# Patient Record
Sex: Male | Born: 1975 | Race: White | Hispanic: No
Health system: Western US, Academic
[De-identification: ages and names within clinical notes are randomized; demographics above are authoritative.]

## PROBLEM LIST (undated history)

## (undated) DIAGNOSIS — I2699 Other pulmonary embolism without acute cor pulmonale: Secondary | ICD-10-CM

## (undated) DIAGNOSIS — F32A Depression, unspecified: Secondary | ICD-10-CM

## (undated) DIAGNOSIS — F909 Attention-deficit hyperactivity disorder, unspecified type: Secondary | ICD-10-CM

## (undated) HISTORY — DX: Other pulmonary embolism without acute cor pulmonale: I26.99

---

## 2004-02-25 ENCOUNTER — Ambulatory Visit (INDEPENDENT_AMBULATORY_CARE_PROVIDER_SITE_OTHER): Payer: No Typology Code available for payment source

## 2004-02-25 ENCOUNTER — Encounter (INDEPENDENT_AMBULATORY_CARE_PROVIDER_SITE_OTHER): Payer: Self-pay | Admitting: Pediatric Emergency Medicine

## 2004-02-25 NOTE — Nursing Note (Signed)
>>   Charles Wagner, Charles Wagner     02/25/2004   4:09 pm  Preimmunization screening questions have been asked, no allergies to vaccine or components,VIS read, patient has signed immunization consent.

## 2004-04-26 ENCOUNTER — Ambulatory Visit (INDEPENDENT_AMBULATORY_CARE_PROVIDER_SITE_OTHER): Payer: Self-pay | Admitting: Nurse Practitioner

## 2004-04-26 NOTE — Progress Notes (Signed)
This patient failed a scheduled appointment today.  Disposition: Chart reviewed, no fu necessary

## 2004-08-28 ENCOUNTER — Encounter (INDEPENDENT_AMBULATORY_CARE_PROVIDER_SITE_OTHER): Payer: No Typology Code available for payment source | Admitting: Nurse Practitioner

## 2012-10-14 DIAGNOSIS — K4090 Unilateral inguinal hernia, without obstruction or gangrene, not specified as recurrent: Secondary | ICD-10-CM | POA: Insufficient documentation

## 2013-01-16 DIAGNOSIS — Z8709 Personal history of other diseases of the respiratory system: Secondary | ICD-10-CM | POA: Insufficient documentation

## 2013-01-16 DIAGNOSIS — Z9109 Other allergy status, other than to drugs and biological substances: Secondary | ICD-10-CM | POA: Insufficient documentation

## 2016-02-13 ENCOUNTER — Encounter (INDEPENDENT_AMBULATORY_CARE_PROVIDER_SITE_OTHER): Payer: PPO | Admitting: Student in an Organized Health Care Education/Training Program

## 2017-04-15 DIAGNOSIS — F3342 Major depressive disorder, recurrent, in full remission: Secondary | ICD-10-CM | POA: Insufficient documentation

## 2017-04-15 DIAGNOSIS — F419 Anxiety disorder, unspecified: Secondary | ICD-10-CM | POA: Insufficient documentation

## 2017-08-18 IMAGING — CR DX Humerus RT
2 series · 2 of 2 positions shown · non-contrast
Comparison: none

Examination: DX Humerus RT                                                                
 Reason for examination:  Injury . Right arm pain. Patient status post ground              
 level fall.
TECHNIQUE: 2 views of the right humerus were obtained.

[ap]
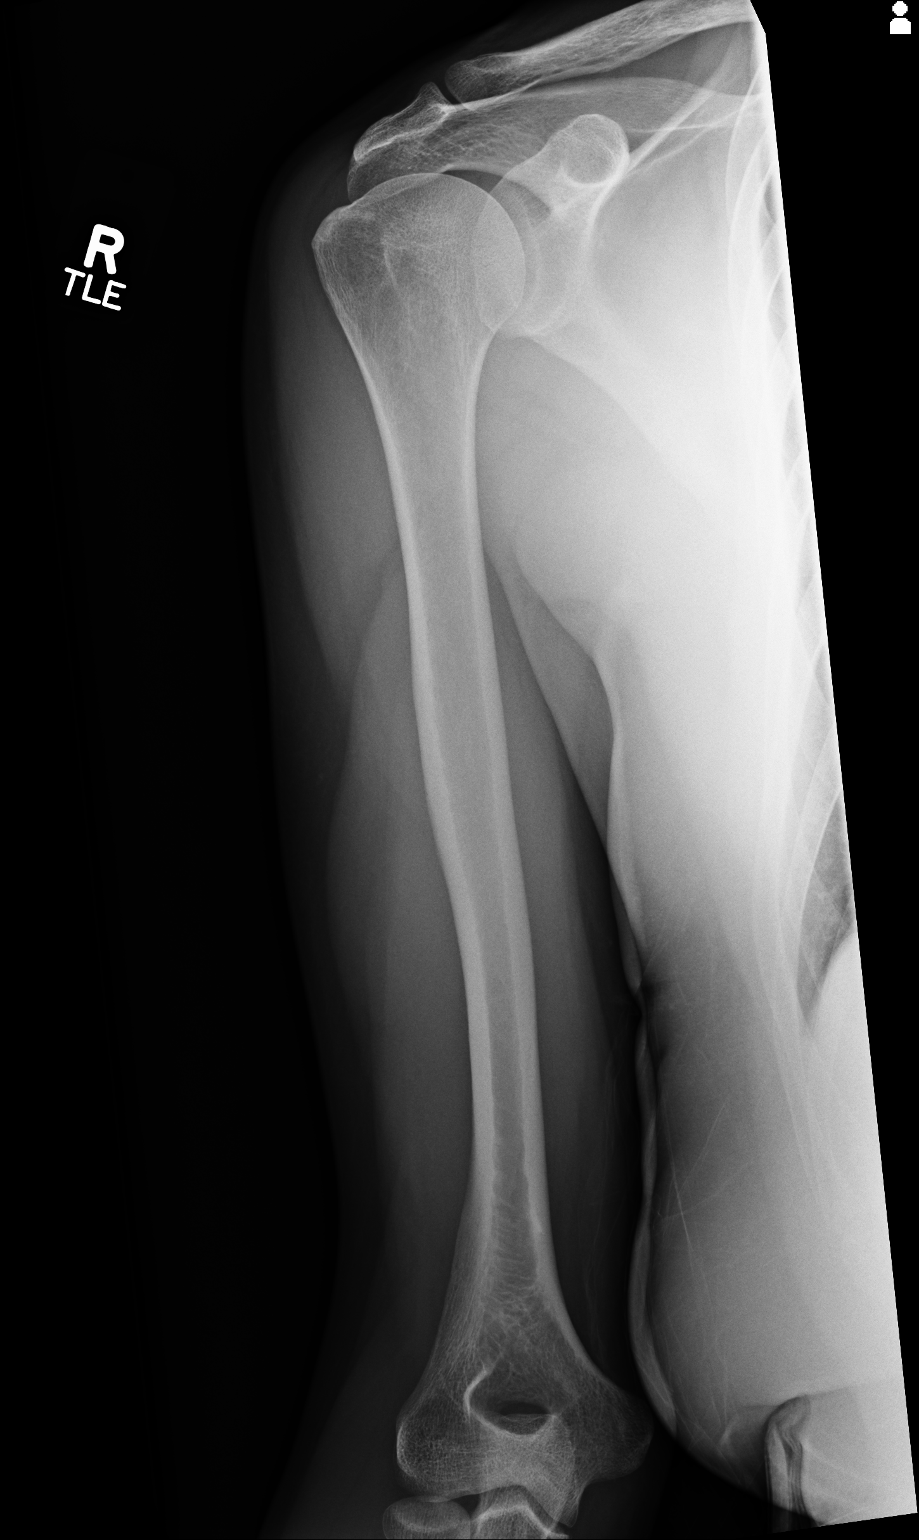

[lat]
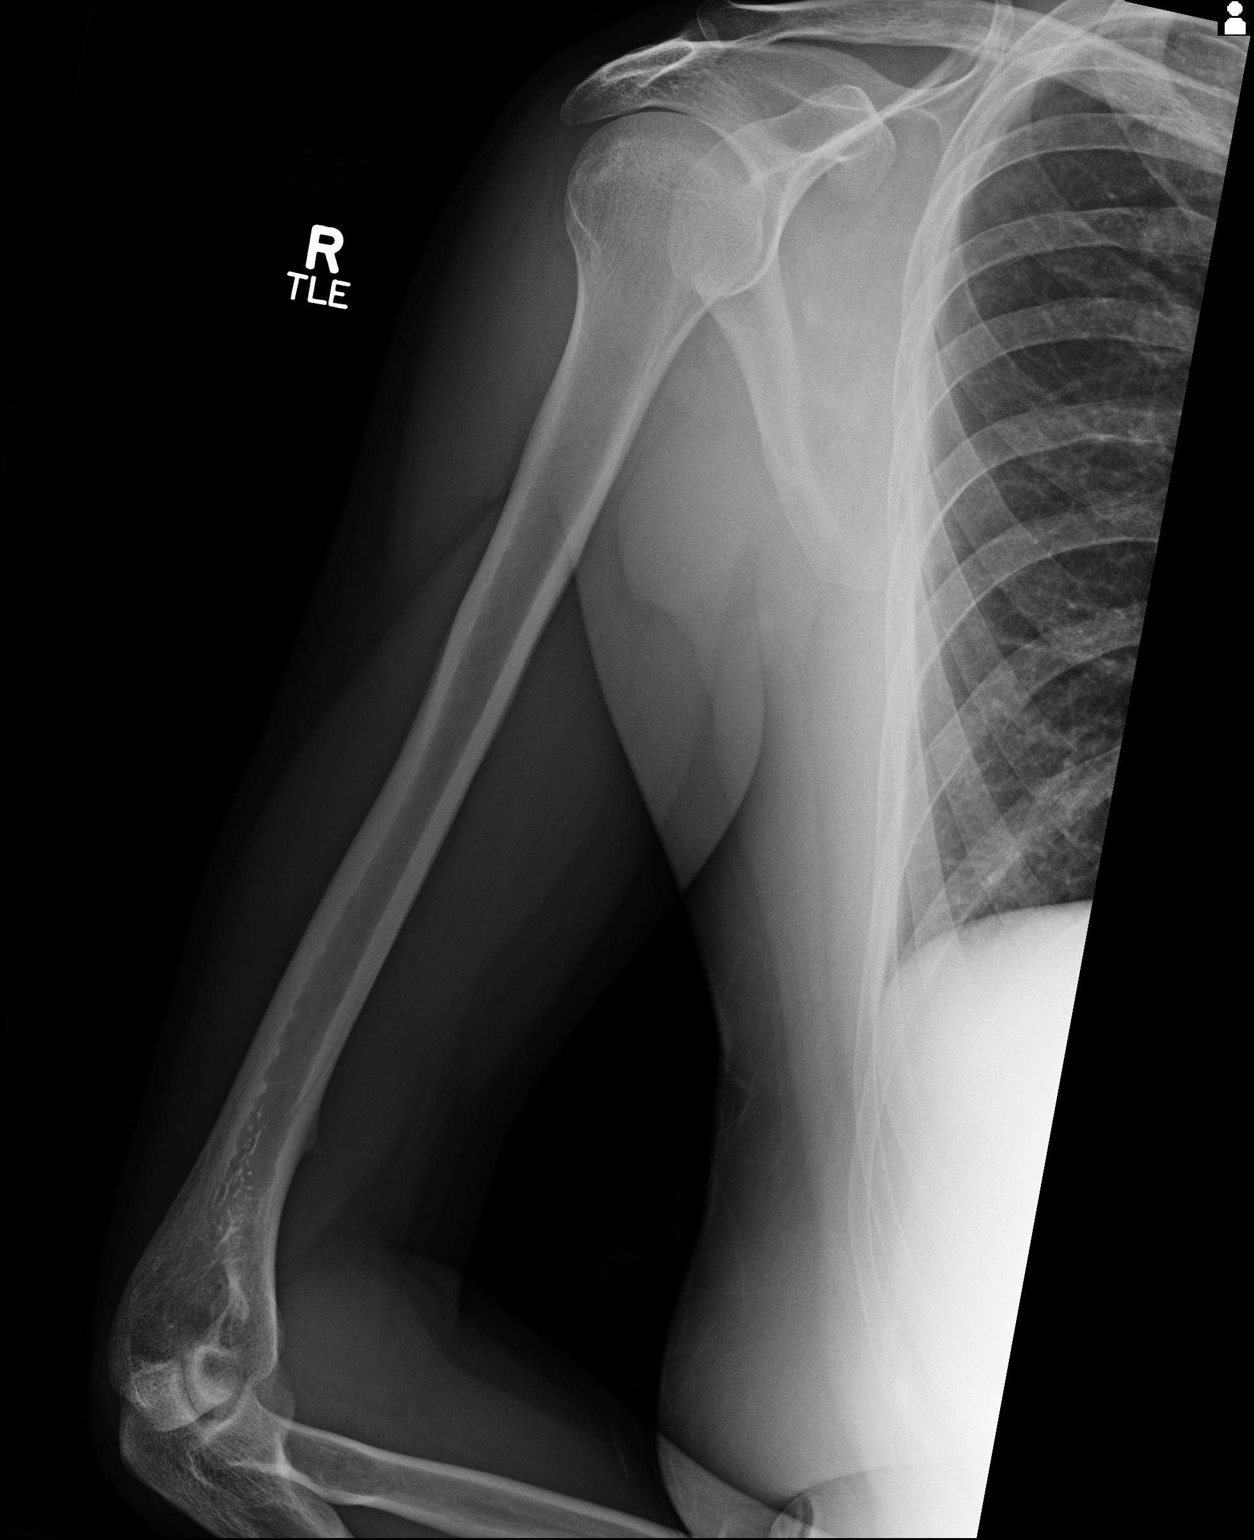

[2 of 2 positions shown; findings below may reference images not displayed]

FINDINGS: No acute fracture or dislocation. No periosteal reaction or osseous lesion.               
 Avian spur noted along the distal humerus, a normal variant.                              
 No significant soft tissue abnormalities.
IMPRESSION: No acute osseous abnormalities.

## 2021-01-04 DIAGNOSIS — F900 Attention-deficit hyperactivity disorder, predominantly inattentive type: Secondary | ICD-10-CM | POA: Insufficient documentation

## 2021-06-07 DIAGNOSIS — E781 Pure hyperglyceridemia: Secondary | ICD-10-CM | POA: Insufficient documentation

## 2021-08-04 ENCOUNTER — Emergency Department (EMERGENCY_DEPARTMENT_HOSPITAL): Payer: PRIVATE HEALTH INSURANCE

## 2021-08-04 ENCOUNTER — Emergency Department
Admission: EM | Admit: 2021-08-04 | Discharge: 2021-08-04 | Disposition: A | Discharge status: Home / Self Care | Payer: PRIVATE HEALTH INSURANCE | Attending: Internal Medicine | Admitting: Internal Medicine

## 2021-08-04 ENCOUNTER — Other Ambulatory Visit (HOSPITAL_BASED_OUTPATIENT_CLINIC_OR_DEPARTMENT_OTHER): Payer: Self-pay

## 2021-08-04 ENCOUNTER — Encounter (HOSPITAL_COMMUNITY): Payer: Self-pay | Admitting: Unknown Physician Specialty

## 2021-08-04 DIAGNOSIS — I2694 Multiple subsegmental pulmonary emboli without acute cor pulmonale: Secondary | ICD-10-CM

## 2021-08-04 DIAGNOSIS — R079 Chest pain, unspecified: Secondary | ICD-10-CM

## 2021-08-04 DIAGNOSIS — R0789 Other chest pain: Secondary | ICD-10-CM

## 2021-08-04 DIAGNOSIS — I498 Other specified cardiac arrhythmias: Secondary | ICD-10-CM

## 2021-08-04 DIAGNOSIS — M79662 Pain in left lower leg: Secondary | ICD-10-CM

## 2021-08-04 HISTORY — DX: Attention-deficit hyperactivity disorder, unspecified type: F90.9

## 2021-08-04 HISTORY — DX: Depression, unspecified: F32.A

## 2021-08-04 LAB — LAB ADD ON ORDER

## 2021-08-04 LAB — COMPREHENSIVE METABOLIC PANEL
ALT (GPT): 32 U/L (ref 10–64)
AST (GOT): 18 U/L (ref 9–38)
Albumin: 4.5 g/dL (ref 3.5–5.2)
Alkaline Phosphatase (Total): 56 U/L (ref 36–122)
Anion Gap: 9 (ref 4–12)
Bilirubin (Total): 0.7 mg/dL (ref 0.2–1.3)
Calcium: 9.5 mg/dL (ref 8.9–10.2)
Carbon Dioxide, Total: 26 meq/L (ref 22–32)
Chloride: 103 meq/L (ref 98–108)
Creatinine: 0.96 mg/dL (ref 0.51–1.18)
Glucose: 86 mg/dL (ref 62–125)
Potassium: 3.8 meq/L (ref 3.6–5.2)
Protein (Total): 7.5 g/dL (ref 6.0–8.2)
Sodium: 138 meq/L (ref 135–145)
Urea Nitrogen: 16 mg/dL (ref 8–21)
eGFR by CKD-EPI 2021: >60 mL/min/{1.73_m2} (ref >59)

## 2021-08-04 LAB — CBC, DIFF
% Basophils: 1 %
% Eosinophils: 2 %
% Immature Granulocytes: 1 %
% Lymphocytes: 16 %
% Monocytes: 13 %
% Neutrophils: 67 %
% Nucleated RBC: 0 %
Absolute Eosinophil Count: 0.24 10*3/uL (ref 0.00–0.50)
Absolute Lymphocyte Count: 1.68 10*3/uL (ref 1.00–4.80)
Basophils: 0.09 10*3/uL (ref 0.00–0.20)
Hematocrit: 43 % (ref 38.0–50.0)
Hemoglobin: 14.3 g/dL (ref 13.0–18.0)
Immature Granulocytes: 0.05 10*3/uL (ref 0.00–0.05)
MCH: 27.9 pg (ref 27.3–33.6)
MCHC: 33.4 g/dL (ref 32.2–36.5)
MCV: 84 fL (ref 81–98)
Monocytes: 1.36 10*3/uL — ABNORMAL HIGH (ref 0.00–0.80)
Neutrophils: 7.1 10*3/uL — ABNORMAL HIGH (ref 1.80–7.00)
Nucleated RBC: 0 10*3/uL
Platelet Count: 342 10*3/uL (ref 150–400)
RBC: 5.12 10*6/uL (ref 4.40–5.60)
RDW-CV: 12.9 % (ref 11.0–14.5)
WBC: 10.52 10*3/uL — ABNORMAL HIGH (ref 4.3–10.0)

## 2021-08-04 LAB — 1ST EXTRA PEARL TOP

## 2021-08-04 LAB — 1ST EXTRA ORANGE TOP

## 2021-08-04 LAB — 1ST EXTRA GOLD TOP

## 2021-08-04 LAB — D-DIMER,QUANT: D_Dimer, Quant: 1.12 ug{FEU}/mL — ABNORMAL HIGH (ref 0.00–0.59)

## 2021-08-04 LAB — TROPONIN_I
Troponin_I Interpretation: NORMAL
Troponin_I: <0.03 ng/mL (ref <0.04)

## 2021-08-04 LAB — PARTIAL THROMBOPLASTIN TIME: Partial Thromboplastin Time: 29 s (ref 22–35)

## 2021-08-04 LAB — PROTHROMBIN TIME
Prothrombin INR: 1 (ref 0.8–1.3)
Prothrombin Time Patient: 12.9 s (ref 10.7–15.6)

## 2021-08-04 LAB — MAGNESIUM: Magnesium: 2 mg/dL (ref 1.8–2.4)

## 2021-08-04 MED ORDER — ACETAMINOPHEN 500 MG OR TABS
1000.0000 mg | ORAL_TABLET | Freq: Once | ORAL | Status: AC
Start: 2021-08-04 — End: 2021-08-04
  Administered 2021-08-04: 1000 mg via ORAL
  Filled 2021-08-04: qty 2

## 2021-08-04 MED ORDER — IOHEXOL 350 MG/ML IV SOLN
100.0000 mL | Freq: Once | INTRAVENOUS | Status: AC | PRN
Start: 2021-08-04 — End: 2021-08-04
  Administered 2021-08-04: 100 mL via INTRAVENOUS

## 2021-08-04 MED ORDER — KETOROLAC TROMETHAMINE 15 MG/ML IJ SOLN
15.0000 mg | Freq: Once | INTRAMUSCULAR | Status: AC
Start: 2021-08-04 — End: 2021-08-04
  Administered 2021-08-04: 15 mg via INTRAVENOUS
  Filled 2021-08-04: qty 1

## 2021-08-04 MED ORDER — APIXABAN 5 MG OR TABS
5.0000 mg | ORAL_TABLET | Freq: Two times a day (BID) | ORAL | 1 refills | Status: DC
Start: 2021-08-04 — End: 2021-08-04

## 2021-08-04 MED ORDER — APIXABAN 5 MG OR TABS
ORAL_TABLET | ORAL | 0 refills | Status: DC
Start: 2021-08-04 — End: 2023-07-02
  Filled 2021-08-04: qty 28, 7d supply, fill #0
  Filled 2021-08-09: qty 28, 14d supply, fill #1

## 2021-08-04 NOTE — Discharge Instructions (Addendum)
You were evaluated in the ED for chest pain and found to have blood clots in your lungs. I have prescribed you a medicine that thins the blood. Please take this medicine as prescribed (10mg  twice daily for first 7 days, 5mg  twice daily for rest of time). It is very important to follow up with your [facility] for further anticoagulation management. Please return to ED if you experience worsening chest pain, shortness of breath, you pass out, or other concerning/worsening symptoms.    The Ultrasound shows no clot in the leg,

## 2021-08-04 NOTE — ED Triage Notes (Signed)
2 days of left chest wall pain, constant and "dull". +reproducible.  Non-toxic, no cough, no F/ch

## 2021-08-04 NOTE — Progress Notes (Signed)
Anticoagulation Education - DOAC    Primary learner(s): Patient    Interpreter was present: No    Direct oral anticoagulant: apixaban    Dosing regimen: 10 mg twice daily for 7 days, then 5 mg twice daily    Indication: VTE treatment    Factors influencing anticoagulation (e.g. renal dysfunction, drug interactions, etc): none    Education materials discussed and provided: Yes, DOAC handout.     DOAC Discussion Topics Included:   Rationale for therapy   Basic explanation of how medication works   Dosing and the importance of taking as instructed   Adverse effects including signs and symptoms of bleeding   Signs and symptoms of venous/pulmonary thromboembolism   Drug interactions    Medication cost/insurance coverage     Post-education response: States understanding    Marlow Baars, PharmD

## 2021-08-04 NOTE — ED Notes (Signed)
Pt arrives w/ c/o chest pain and L shoulder pain for approximately 2 days. Pt's father at bedside, endorses family history of MI. AOx4.      Orie Fisherman, RN  08/04/21 1126

## 2021-08-04 NOTE — ED Provider Notes (Signed)
CHIEF COMPLAINT   Chief Complaint   Patient presents with    Chest Pain            HISTORY OF PRESENT ILLNESS   46 y/o M with hx of ADHD and MDD presenting with 2 days of left sided chest pain that radiates to his left shoulder and neck. States it started late at night and is worse with movement. Describes it as a constant achy sensation with episodes of sharp pain with movement. Associated shortness of breath and pain with inspiration. Denies any trauma, heavy lifting, recent long car rides/plane rides, hormone use, hx of malignancy, recent surgery, hemoptysis. Has been taking tylenol with minimal relief in his pain. Endorses family history of hypertension, diabetes, and MI. Also states he was a prior smoker (quit in 2010). Denis any heavy alcohol use or drug use.                  PAST MEDICAL AND SURGICAL HISTORY     PMHx: ADHD, depression  PSx: denies           MEDICATIONS AND ALLERGIES     OUTPATIENT MEDICATIONS:   Current Outpatient Medications   Medication Instructions    apixaban (ELIQUIS) 5 mg, Oral, 2 times daily, Please take 32m twice daily for 7 days, then 576mtwice daily until you can be evaluated by your primary care physician       ALLERGIES:   Patient has no known allergies.          SOCIAL HISTORY   Social History     Tobacco Use    Smoking status: Former     Years: 10.00     Types: Cigarettes     Quit date: 2010     Years since quitting: 13.0   Substance Use Topics    Alcohol use: Yes    Drug use: Never              PAST FAMILY HISTORY   Family History     Problem (# of Occurrences) Relation (Name,Age of Onset)    Diabetes (1) Father    Heart Attack (1) Father             REVIEW OF SYSTEMS   Review of Systems  10/14 Review of Systems completed and negative except as stated above in the HPI (Systems reviewed: HENT, Eyes, Resp, CV, GI, GU, MSK, Skin, Neuro, Psych)           PHYSICAL EXAM   ED VITALS:  Vitals (Arrival)      T: 36 C (08/04/21 1117)  BP: 138/84 (08/04/21 1117)  HR: 79 (08/04/21  1117)  RR: 16 (08/04/21 1117)  SpO2: 97 % (08/04/21 1117) Room air   Vitals (Most recent in last 24 hrs)   T: 36.8 C (08/04/21 1123)  BP: 137/84 (08/04/21 1330)  HR: 90 (08/04/21 1330)  RR: 20 (08/04/21 1330)  SpO2: 100 % (08/04/21 1330) Room air  T range: Temp  Min: 36 C  Max: 36.8 C  (no weight taken for this visit)     (no height taken for this visit)     There is no height or weight on file to calculate BMI.       Physical Exam  Vitals and nursing note reviewed.   Constitutional:       General: He is not in acute distress.     Appearance: He is well-developed. He is not ill-appearing.   HENT:  Head: Normocephalic and atraumatic.   Eyes:      Extraocular Movements: Extraocular movements intact.      Pupils: Pupils are equal, round, and reactive to light.   Neck:      Musculoskeletal: Neck supple.   Cardiovascular:      Rate and Rhythm: Regular rhythm. Tachycardia present.      Heart sounds: Normal heart sounds.    No systolic murmur is present.   No diastolic murmur is present.  Pulmonary:      Effort: Pulmonary effort is normal. No respiratory distress.      Breath sounds: Normal breath sounds.   Chest:      Comments: Mild chest wall tenderness just inferior to nipple  Abdominal:      Palpations: Abdomen is soft.      Tenderness: There is no guarding or rebound.   Skin:     General: Skin is warm and dry.      Capillary Refill: Capillary refill takes less than 2 seconds.   Neurological:      General: No focal deficit present.      Mental Status: He is alert and oriented to person, place, and time.   Psychiatric:         Mood and Affect: Mood normal.               LABORATORY:   Labs Reviewed   CBC, DIFF - Abnormal       Result Value    WBC 10.52 (*)     RBC 5.12      Hemoglobin 14.3      Hematocrit 43      MCV 84      MCH 27.9      MCHC 33.4      Platelet Count 342      RDW-CV 12.9      % Neutrophils 67      % Lymphocytes 16      % Monocytes 13      % Eosinophils 2      % Basophils 1      % Immature  Granulocytes 1      Neutrophils 7.10 (*)     Absolute Lymphocyte Count 1.68      Monocytes 1.36 (*)     Absolute Eosinophil Count 0.24      Basophils 0.09      Immature Granulocytes 0.05      Nucleated RBC 0.00      % Nucleated RBC 0     D-DIMER,QUANT - Abnormal    D_Dimer, Quant 1.12 (*)    COMPREHENSIVE METABOLIC PANEL    Sodium 347      Potassium 3.8      Chloride 103      Carbon Dioxide, Total 26      Anion Gap 9      Glucose 86      Urea Nitrogen 16      Creatinine 0.96      Protein (Total) 7.5      Albumin 4.5      Bilirubin (Total) 0.7      Calcium 9.5      AST (GOT) 18      Alkaline Phosphatase (Total) 56      ALT (GPT) 32      eGFR by CKD-EPI 2021 >60     MAGNESIUM    Magnesium 2.0     PROTHROMBIN TIME    Prothrombin Time Patient 12.9  Prothrombin INR 1.0     PARTIAL THROMBOPLASTIN TIME    Partial Thromboplastin Time 29      Partial Thromboplastin X Mean        Value: To calculate the PTT X Mean divide PTT value by 29.   TROPONIN_I    Troponin_I <0.03      Troponin_I Interpretation Normal     LAB ADD ON ORDER    Lab Test Requested ddimer      Specimen Type/Description Blood      Sample To Use Most recent      Test Request Status Order Processed     PROTEIN C ACTIVITY    Protein C Activity        Value: Data entry correction for lab automation processing. Do not re-order in CPOE.   PROTEIN S ACTIVITY    Protein S Activity        Value: Data entry correction for lab automation processing. Do not re-order in CPOE.   FACTOR V ACTIVITY    Factor V Activity        Value: Data entry correction for lab automation processing. Do not re-order in CPOE.   FACTOR V ACTIVITY   PROTEIN C ACTIVITY   PROTEIN S ACTIVITY         IMAGING:     ED Wet Read -   CTA Chest PE w Contrast   Final Result   Two acute right lower lobe subsegmental pulmonary embolisms without evidence of RV pressure overload.      I have personally reviewed the images and agree with the report (or as edited).      XR Chest 1 View   Final Result       Lines and tubes: None.      Lungs: Clear.      Pleura: No effusion. No pneumothorax.      Heart and mediastinum: Unremarkable.      Bones: No acute or suspicious abnormality.      I have personally reviewed the images and agree with the report (or as edited).          Radiology Final Result -   No image results found.              EKG DOCUMENTATION      My interpretation is as follows:     Rate: 77  Rhythm:Rhythm: Normal sinus rhythm  Axis:Axis Interpretation: Normal  ST Changes:ST Changes: None  Ectopy:Ectopy: None  Intervals:Intervals: Normal           TRAUMA/STROKE/STEMI ACTIVATION    N/a             SUICIDE RISK EVALUATION                 SEPSIS               4TH  YEAR RESIDENT            ED COURSE/MEDICAL DECISION MAKING   ED Course as of 08/04/21 1352   Fri Aug 04, 2021   1147 XR Chest 1 View  I independently reviewed the image and no evidence of pneumonia, pneumothorax, hemothorax, widened mediastinum, rib fractures, pleural effusion, pulmonary edema, or other cardiopulmonary abnormality [ND]   1200 Tylenol and toradol ordered for pain control [ND]   1215 D-Dimer, Quant(!)  Elevated to 1.12. Will further evaluate with CT PE. Patient updated on plan [ND]   1215 Troponin-I  Within normal limits. Given chest pain onset 2 days ago,  no indication for delta and low suspicion for NSTEMI, myocarditis at this time [ND]   1243 CBC, CMP, coags are within normal limits and without evidence of anemia, AKI, electrolyte abnormality, liver dysfunction [ND]   1323 HEART score of 1: low risk [ND]   6283 CTA Chest PE w Contrast  CTPE notable for two acute subsegmental Pes in the right lower lobe without evidence of RV strain [ND]   1336 D/w patient results of CT and evidence of blood clots. No evidence of RV strain or hemodynamic instability requiring admission to the hospital. Symptoms likely due to multiple Pes. No inciting event so will likely require anticoagulation for life. Plan to discharge patient on eliquis 80m BID  x 7 days and 56mBID for at least 3 months [ND]   [id number] at length with the patient in regards to his results. He verified that he will be able to follow up with his PCM for anticoagulation following. He was cautioned about his increase bleeding risk and strict return precautions were discussed. Additional labs were sent to facilitate work up on etiology of patient's pulmonary emboli. All questions were answered and he was discharged in stable condition. [ND]      ED Course User Index  [ND] DoLen Blalock [age]/o M with hx of ADHD, depression presenting with 2 days of left sided chest pain. Vital signs upon presentation notable for a slightly elevated HR of 103, normotension, afebrile, satting >95% on room air. Nontoxic appearing. Physical exam notable for mild left sided chest wall tenderness to palpation just inferior to the nipple, otherwise negative. ECG notable for sinus rhythm at 77bpm without evidence of ST elevation/depression, q waves, t wave inversion. Differential includes but is not limited to ACS, PE, pneumonia, pericarditis, myocarditis, musculoskeletal pain, among others. Will obtain lab work, CXR for further evaluation. Low risk via [place]. Low suspicion for acute aortic dissection given hemodynamic stability, no evidence of widened mediastinum on CXR, no pulsatile mass palpated on physical exam.                CLINICAL IMPRESSION/DISPOSITION   Clinical Impressions:   [I26.94] Multiple subsegmental pulmonary emboli without acute cor pulmonale (HCC)         Disposition: Discharge         CRITICAL CARE - ATTENDING ONLY            ADDITIONAL INFORMATION REVIEWED  - ATTENDING ONLY             DoLen BlalockResident  08/04/21 1353    Pleuritic CP and SOB  Considered ACS, STEMI, pneumonia, trauma, dissection, GI  CT.pe multiple subseg PE  Etiology unclear  No RV strain.  DC w script for DOAC. Encouraged PCP FU for workup    I saw and evaluated the patient. I have reviewed the  resident's/fellow's findings and agree.     No Critical Care       NiCarollee LeitzMD  08/16/21 1019

## 2021-08-07 ENCOUNTER — Encounter (HOSPITAL_COMMUNITY): Payer: Self-pay

## 2021-08-07 ENCOUNTER — Other Ambulatory Visit (HOSPITAL_BASED_OUTPATIENT_CLINIC_OR_DEPARTMENT_OTHER): Payer: Self-pay

## 2021-08-07 LAB — PROTEIN S ACTIVITY: Protein S Activity: 88 % (ref 65–150)

## 2021-08-07 LAB — PROTEIN C ACTIVITY: Protein C Activity: 118 % (ref 65–150)

## 2021-08-07 LAB — EKG 12 LEAD
Atrial Rate: 77 {beats}/min
Diagnosis: NORMAL
P Axis: 61 degrees
P-R Interval: 146 ms
Q-T Interval: 374 ms
QRS Duration: 86 ms
QTC Calculation: 423 ms
R Axis: 64 degrees
T Axis: 63 degrees
Ventricular Rate: 77 {beats}/min

## 2021-08-08 ENCOUNTER — Other Ambulatory Visit (HOSPITAL_BASED_OUTPATIENT_CLINIC_OR_DEPARTMENT_OTHER): Payer: Self-pay

## 2021-08-09 ENCOUNTER — Other Ambulatory Visit (HOSPITAL_BASED_OUTPATIENT_CLINIC_OR_DEPARTMENT_OTHER): Payer: Self-pay

## 2021-08-28 DIAGNOSIS — I2694 Multiple subsegmental pulmonary emboli without acute cor pulmonale: Secondary | ICD-10-CM | POA: Insufficient documentation

## 2021-09-21 DIAGNOSIS — Z1589 Genetic susceptibility to other disease: Secondary | ICD-10-CM | POA: Insufficient documentation

## 2023-07-02 ENCOUNTER — Telehealth: Payer: 59 | Admitting: Family

## 2023-07-02 DIAGNOSIS — Z86711 Personal history of pulmonary embolism: Secondary | ICD-10-CM

## 2023-07-02 DIAGNOSIS — E7211 Homocystinuria: Secondary | ICD-10-CM | POA: Insufficient documentation

## 2023-07-02 MED ORDER — APIXABAN 2.5 MG OR TABS
2.5000 mg | ORAL_TABLET | Freq: Two times a day (BID) | ORAL | 1 refills | Status: DC
Start: 2023-07-02 — End: 2023-09-30

## 2023-07-02 NOTE — Progress Notes (Signed)
 Distant Site Telemedicine Encounter  I conducted this encounter via secure, live, face-to-face video conference with the patient. I reviewed the risks and benefits of telemedicine as pertinent to this visit and the patient agreed to proceed.    Provider Location: Off-site location (home, non-[place] location)  Patient Location: At work    Present with patient: No one else present     Chief Complaint   Patient presents with    Medication Management        Subjective:     Charles Wagner is a 47 year old male who presents requesting a refill for Eliquis/Apixaban    Eliquis since Jan 2023. Dx Pulmonary embolisms. No precipitating event.   Seen by hematology for f/u  See 2023 note for details - blood test reviewed hypercoagulable state. Advised to continue anticoagulation indefinitely    Has been uninsured the last few months, has not taken  Now with insurance, new job. Would very much like to restart  Asymptomatic.     Previous primary care through [facility]  PCP retired and [facility] does not take new insurance  Working on establishing primary care at [employer]         Objective:    Vitals: There were no vitals taken for this visit.  Physical Exam  Constitutional:       General: Does not appear to be in distress, converses easily, appears comfortable   Pulmonary:      Effort: Pulmonary effort is normal, able to speak in complete sentences without difficulty   Neurological:      Mental Status: Alert and oriented to person, place, and time.   Psychiatric:         Mood and Affect: Mood normal.         Behavior: Behavior normal.         Thought Content: Thought content normal.         Judgment: Judgment normal.            Assessment and Plan:    Charles Wagner was seen today for medication management.    Hyperhomocysteinemia (HCC)  Notes reviewed  Rx sent  Ideally will establish care with new PCP in the next 3-6 months. If not, be seen by [facility] or other provider for refill, check in  Earlier if any concerns    -     apixaban (Eliquis) 2.5 MG tablet; Take  1 tablet (2.5 mg) by mouth 2 times a day.  Dispense: 180 tablet; Refill: 1    History of pulmonary embolus (PE)  2023, unprovoked  -     apixaban (Eliquis) 2.5 MG tablet; Take 1 tablet (2.5 mg) by mouth 2 times a day.  Dispense: 180 tablet; Refill: 1      The patient's identification was verified by using two patient identifiers and the following: physically presented government photo ID.

## 2023-07-11 NOTE — Telephone Encounter (Signed)
 Message reviewed. Will await the PA for the Eliquis. Patient to be on this medication indefinitely due to a h/o unprovoked PE.

## 2023-07-12 ENCOUNTER — Encounter: Payer: Self-pay | Admitting: Family Medicine

## 2023-07-12 NOTE — Telephone Encounter (Signed)
 Form completed  Please fax to "PA Fax #" ASAP    Daena Alper Lowry Ram  [facility]  07/12/23

## 2023-07-12 NOTE — Telephone Encounter (Signed)
 Have we received fax from pharmacy?    Ashby Dawes  [facility]  07/12/23

## 2023-07-12 NOTE — Telephone Encounter (Signed)
Disregard faxing form  I called insurance, spoke with them for 25 minutes  Med approved  Case ID: [id number]  Patient should be able to get his Eliquis now.  Please let them know and apologies for the delay.    Ashby Dawes  [facility]  07/12/23

## 2023-07-16 NOTE — Progress Notes (Signed)
 Spoke with insurance on 07/12/23, per agent, Eliquis approved after telephone call to insurance lasting 25+ minutes.  Case ID #[id number]    Ashby Dawes  [place]  07/16/23

## 2023-09-30 ENCOUNTER — Other Ambulatory Visit: Payer: Self-pay

## 2023-09-30 ENCOUNTER — Encounter (INDEPENDENT_AMBULATORY_CARE_PROVIDER_SITE_OTHER): Payer: Self-pay

## 2023-09-30 ENCOUNTER — Ambulatory Visit (INDEPENDENT_AMBULATORY_CARE_PROVIDER_SITE_OTHER): Payer: 59

## 2023-09-30 VITALS — BP 124/74 | HR 54 | Temp 97.7°F | Resp 16 | Ht 74.0 in | Wt 208.0 lb

## 2023-09-30 DIAGNOSIS — Z86711 Personal history of pulmonary embolism: Secondary | ICD-10-CM

## 2023-09-30 DIAGNOSIS — Z23 Encounter for immunization: Secondary | ICD-10-CM

## 2023-09-30 DIAGNOSIS — Z1211 Encounter for screening for malignant neoplasm of colon: Secondary | ICD-10-CM

## 2023-09-30 DIAGNOSIS — Z9109 Other allergy status, other than to drugs and biological substances: Secondary | ICD-10-CM

## 2023-09-30 DIAGNOSIS — E7211 Homocystinuria: Secondary | ICD-10-CM

## 2023-09-30 DIAGNOSIS — Z0000 Encounter for general adult medical examination without abnormal findings: Secondary | ICD-10-CM

## 2023-09-30 DIAGNOSIS — E782 Mixed hyperlipidemia: Secondary | ICD-10-CM

## 2023-09-30 LAB — A1C RAPID, ONSITE: Hemoglobin A1C: 5.1 % (ref 4.0–6.0)

## 2023-09-30 MED ORDER — APIXABAN 2.5 MG OR TABS
2.5000 mg | ORAL_TABLET | Freq: Two times a day (BID) | ORAL | 3 refills | Status: DC
Start: 2023-09-30 — End: 2024-01-03

## 2023-09-30 MED ORDER — FLUTICASONE PROPIONATE 50 MCG/ACT NA SUSP
1.0000 | Freq: Every day | NASAL | 11 refills | Status: DC
Start: 2023-09-30 — End: 2024-06-18

## 2023-09-30 MED ORDER — INFLUENZA VIRUS VACC SPLIT PF 0.5 ML IM SUSY
0.5000 mL | PREFILLED_SYRINGE | Freq: Once | INTRAMUSCULAR | Status: AC
Start: 2023-09-30 — End: 2023-09-30
  Administered 2023-09-30: 0.5 mL via INTRAMUSCULAR

## 2023-09-30 MED ORDER — COVID-19 MRNA VACC (MODERNA) 50 MCG/0.5ML IM SUSY
50.0000 ug | PREFILLED_SYRINGE | Freq: Once | INTRAMUSCULAR | Status: AC
Start: 2023-09-30 — End: 2024-09-29

## 2023-09-30 NOTE — Progress Notes (Signed)
 Preventative Health Care Updates   Since your last visit, please tell us if you have had any of the below services outside of [facility]:                                                                            Cervical screening/PAP:N/A      Mammo: N/A      Colon Screen: NO   If yes, location/ date.    Specialty Care Updates  Have seen a specialist since your last visit: NO   If yes, Name, location and date.    Any forms to complete today? NO     HM Due:     Health Maintenance   Topic Date Due    Hepatitis C Screening  Never done    Prostate Cancer Screening Education  Never done    Depression Screening (PHQ-2)  Never done    Hepatitis B Vaccine (3 of 3 - 3-dose series) 08/01/1992    Lipid Disorders Screening  Never done    Colorectal Cancer Screening  Never done    COVID-19 Vaccine (4 - 2024-25 season) 03/31/2023    Influenza Vaccine (1) 04/30/2023    DTaP, Tdap and Td Vaccines (3 - Td or Tdap) 04/16/2027    HIV Screening  Completed    Hepatitis A Vaccine  Aged Out    Meningococcal B Vaccine  Aged Out    Pneumococcal Vaccine: Pediatrics (0-5 years) and At-Risk Patients (6-49 years)  Aged Out    HPV Vaccine  Aged Out           Future Appointments   Date Time Provider Department Center   09/30/2023  2:20 PM Halstrom, Leeanne Mannan, MD Steve Rattler Marissa Calamity

## 2023-09-30 NOTE — Progress Notes (Signed)
 Vaccine Screening Questions    Interpreter: No    1. Are you allergic to Latex? NO    2.  Have you had a serious reaction or an allergic reaction to a vaccine?  NO    3.  Currently have a moderate or severe illness, including fever?  NO    4.  Ever had a seizure or any neurological problem associated with a vaccine? (DTaP/TDaP/DTP pertinent) NO    5.  Is patient receiving any live vaccinations today? (Varicella-Chickenpox, MMR-Measles/Mumps/Rubella, Zoster-Shingles, Flumist, Yellow Fever) NOTE: oral rotavirus is exempt  NO    If YES to any of the questions above - Do NOT give vaccine.  Consult with RN or provider in clinic.  (#5 can be YES if all Live vaccine questions are answered NO)    If NO to all questions above - Patient may receive vaccine.    6. Are you pregnant or is there a chance you could become pregnant during the next month (HPV pertinent) NO    7. Do you need to receive the Flu vaccine today? Yes    All patients are encouraged to wait 15 minutes before leaving after receiving any vaccine.    VIS given 09/30/2023 by Mariann Barter, CMA.    Injection given today without initial adverse effect. YES    Mariann Barter, CMA

## 2023-09-30 NOTE — Progress Notes (Signed)
 Use of Ambient Listening:   Was ambient listening technology used during this visit and was verbal consent for recording obtained? Yes, verbal consent was obtained      Chief Complaint   Patient presents with    Wellness    Insomnia     Started this week       Subjective:  Charles Wagner is a 48 year old male who presents on 09/30/23.      History of Present Illness  Charles Wagner is a 48 year old male who presents for a wellness visit.    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 He also takes an over-the-counter vitamin cocktail, possibly Metanx, which he orders online. His lab results stabilized after starting this regimen, but he continues on Eliquis.    He is concerned about his cardiovascular health as he enters middle age, given his family history of cardiovascular issues. His father had a heart attack in his thirties and multiple heart surgeries, and both paternal grandparents died of heart disease. He maintains an active lifestyle, walking three to five miles a day, and follows a whole foods diet. He quit smoking 15 years ago and consumes alcohol moderately.    He has a history of depression and ADHD. He participated in a medical study for a new antidepressant, which he found effective, but the study is ending soon. He has tried Bupropion in the past without success.    He experiences chronic migraines, about one or two per month, and recently had episodes of vertigo that he self-diagnosed as vestibular migraines. He takes Flonase nightly for narrow nasal passages, which he finds significantly improves his quality of life.    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 He practices good sleep hygiene and uses vitamin D and melatonin.    He has no history of cancer in his family but  is aware of the need for regular screenings. He is considering colon cancer screening options as he has reached the eligible age. He has not had a recent eye exam but reports no significant vision changes. He is up to date with COVID and flu vaccinations but has not received them this year. Hepatitis B immunity status is unknown, and he is open to checking titers.  LIFESTYLE  Current dietary habits: healthy diet in general  Supplements: mthr vitamin supplement   Current exercise habits: yes, engages in regular exercise    Social Determinants of Health:  Social Determininants of Health affecting medical care: na    Intimate Partner Violence Screening:  History of sexual or physical abuse: No  History of any other forms of abuse (e.g. verbal, financial): No  Has the patient been hit, kicked, punched, or otherwise hurt by someone within the past year?: No    CANCER SCREENING  Family history of colon cancer: NO  Family history of prostate cancer: NO  Family history of breast cancer: NO      Review Of Systems  Reviewed, pertinent ROS reflected in HPI    I have reviewed and updated the problem list, medical history, family history, social history, medications, allergies in the electronic medical record.    Objective:    Vitals   Reviewed  Objective   Blood pressure 124/74, pulse (!) 54, temperature  36.5 C, temperature source Temporal, resp. rate 16, height 6\' 2"  (1.88 m), weight 94.3 kg (208 lb), SpO2 100%.      Physical Exam  Vitals reviewed.   Constitutional:       General: He is not in acute distress.     Appearance: Normal appearance. He is not toxic-appearing.   HENT:      Head: Normocephalic and atraumatic.   Eyes:      General: No scleral icterus.     Pupils: Pupils are equal, round, and reactive to light.   Cardiovascular:      Rate and Rhythm: Normal rate and regular rhythm.      Heart sounds: Normal heart sounds. No murmur heard.  Pulmonary:      Effort: Pulmonary effort is normal. No respiratory distress.       Breath sounds: Normal breath sounds.   Abdominal:      General: Abdomen is flat. There is no distension.      Palpations: Abdomen is soft.      Tenderness: There is no abdominal tenderness.   Musculoskeletal:      Right lower leg: No edema.      Left lower leg: No edema.   Skin:     General: Skin is warm and dry.      Findings: No lesion or rash.   Neurological:      Mental Status: He is alert. Mental status is at baseline.   Psychiatric:         Mood and Affect: Mood normal.         Behavior: Behavior normal.           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 On Eliquis 2.5 mg indefinitely as per hematology recommendation. No recent embolic events.  - Continue Eliquis 2.5 mg indefinitely.  - Order homocysteine level check.  - Consider hematology consult if needed.    Hyperlipidemia  Slightly elevated cholesterol with strong family history of cardiovascular disease. Discussed statin therapy as potential for reducing risk.   - Order lipid panel.  - Consider statin therapy based on lipid panel results and family history.  - Discuss cardiovascular risk reduction strategies.    Chronic Migraines  Chronic migraines with recent vertigo episodes, possibly vestibular migraines.  - Monitor migraine frequency and symptoms.  - Consider further evaluation if vertigo episodes persist.    Insomnia  Insomnia episodes likely related to stress from acting. Practices good sleep hygiene.  - Continue sleep hygiene practices.  - Consider melatonin and vitamin D supplementation.    Depression  Depression improved on investigational antidepressant. Concern about potential relapse post-study.  - Monitor mood and symptoms.  - Contact study team, pcp if symptoms return.  - Consider alternative treatments if needed.      Colorectal Cancer Screening  Eligible for colorectal cancer screening at age 26. Prefers  colonoscopy for longer interval between screenings.  - Schedule colonoscopy.  - Instruct to hold Eliquis 2 days prior to procedure.    Prostate Cancer Screening  Discussed prostate cancer screening options. Prefers to defer screening for a few years.  - Revisit prostate cancer screening in a few years.    Vaccinations  Due for influenza and COVID vaccinations.  - Administer influenza vaccine.  - Clinic out of COVID vaccine; pharmacy encouraged  Hepatitis B Immunity  Potential waning immunity to Hepatitis B. No high-risk behaviors reported.  - Check Hepatitis B titers.    Nasal Congestion  Uses Flonase for narrow nasal passages. Reports significant improvement.  - Continue Flonase as needed.         Patient instructed to return to clinic with any new, worsening, or unresolved concerns.

## 2023-09-30 NOTE — Patient Instructions (Signed)
 Welcome,  Charles Wagner , and thank you for choosing me as your primary care doctor! I'm committed to providing you with personalized and comprehensive care.    Preventive care is the cornerstone of my practice. I expect all patients to schedule an in-person visit at least once a year for preventive care and lab work. This allows [place] to monitor your health, address concerns, and maintain the highest standard of care. I prefer in person appointments for multiple concerns rather than telehealth. Long term medication refills will not be provided unless seen in person at least once a year. Additionally, prescriptions for controlled substances require follow-up visits a minimum of every three months.     For non-urgent questions sent via MyChart, responses typically take 3-5 business days. If your query requires more consideration and time, the clinical support staff may recommend scheduling a telehealth visit. Your insurance may be billed for time spent on mychart care coordination. For urgent symptoms, please contact our triage line or seek emergency care.    To continue your care:  1. Schedule your follow up appointments promptly either at the front desk after your visit or through our phone/online booking system.  2. Bring any forms you need filled out with you to your appointments or send ahead via mychart.   3. Come prepared with any questions or concerns.  4. If you have concerns or need help scheduling an appointment start by calling our clinic directly at [phone].     If you have any questions, feel free to contact us. I look forward to partnering with you for your health.    Warm regards,  Charlene Brooke, MD

## 2023-10-01 LAB — CBC, DIFF
% Basophils: 1 %
% Eosinophils: 3 %
% Immature Granulocytes: 0 %
% Lymphocytes: 20 %
% Monocytes: 10 %
% Neutrophils: 66 %
% Nucleated RBC: 0 %
Absolute Eosinophil Count: 0.2 10*3/uL (ref 0.00–0.50)
Absolute Lymphocyte Count: 1.47 10*3/uL (ref 1.00–4.80)
Basophils: 0.05 10*3/uL (ref 0.00–0.20)
Hematocrit: 42 % (ref 38.0–50.0)
Hemoglobin: 13.9 g/dL (ref 13.0–18.0)
Immature Granulocytes: 0.01 10*3/uL (ref 0.00–0.05)
MCH: 27.8 pg (ref 27.3–33.6)
MCHC: 33.4 g/dL (ref 32.2–36.5)
MCV: 83 fL (ref 81–98)
Monocytes: 0.76 10*3/uL (ref 0.00–0.80)
Neutrophils: 4.82 10*3/uL (ref 1.80–7.00)
Nucleated RBC: 0 10*3/uL
Platelet Count: 379 10*3/uL (ref 150–400)
RBC: 5 10*6/uL (ref 4.40–5.60)
RDW-CV: 12.8 % (ref 11.0–14.5)
WBC: 7.31 10*3/uL (ref 4.3–10.0)

## 2023-10-01 LAB — LIPID PANEL
Cholesterol/HDL Ratio: 3.7
HDL Cholesterol: 52 mg/dL (ref >39)
LDL Cholesterol, NIH Equation: 113 mg/dL (ref <130)
Non-HDL Cholesterol: 141 mg/dL (ref 0–159)
Total Cholesterol: 193 mg/dL (ref <200)
Triglyceride: 162 mg/dL — ABNORMAL HIGH (ref <150)

## 2023-10-01 LAB — COMPREHENSIVE METABOLIC PANEL
ALT (GPT): 18 U/L (ref 10–64)
AST (GOT): 18 U/L (ref 9–38)
Albumin: 4.4 g/dL (ref 3.5–5.2)
Alkaline Phosphatase (Total): 50 U/L (ref 39–139)
Anion Gap: 12 (ref 4–12)
Bilirubin (Total): 0.5 mg/dL (ref 0.2–1.3)
Calcium: 9.5 mg/dL (ref 8.9–10.2)
Carbon Dioxide, Total: 26 meq/L (ref 22–32)
Chloride: 104 meq/L (ref 98–108)
Creatinine: 1.01 mg/dL (ref 0.51–1.18)
Glucose: 78 mg/dL (ref 62–125)
Potassium: 4.1 meq/L (ref 3.6–5.2)
Protein (Total): 6.8 g/dL (ref 6.0–8.2)
Sodium: 142 meq/L (ref 135–145)
Urea Nitrogen: 15 mg/dL (ref 8–21)
eGFR by CKD-EPI 2021: >60 mL/min/{1.73_m2} (ref >59)

## 2023-10-01 LAB — HEPATITIS B SURFACE AB
Hepatitis B Surf Antibody Intl Units: <8 m[IU]/mL
Hepatitis B Surface Ab: NONREACTIVE

## 2023-10-01 LAB — TSH WITH REFLEXIVE FREE T4: Thyroid Stimulating Hormone: 2.904 u[IU]/mL (ref 0.400–5.000)

## 2023-10-01 LAB — HOMOCYSTEINE: Homocysteine: 9.7 umol/L (ref 6.2–15.0)

## 2024-01-03 ENCOUNTER — Telehealth: Payer: Self-pay

## 2024-01-03 DIAGNOSIS — E7211 Homocystinuria: Secondary | ICD-10-CM

## 2024-01-03 DIAGNOSIS — Z86711 Personal history of pulmonary embolism: Secondary | ICD-10-CM

## 2024-01-03 MED ORDER — APIXABAN 2.5 MG OR TABS
2.5000 mg | ORAL_TABLET | Freq: Two times a day (BID) | ORAL | 3 refills | Status: AC
Start: 2024-01-03 — End: 2025-01-02

## 2024-01-03 NOTE — Telephone Encounter (Signed)
 Incoming fax from pharmacy.    Issue: Refill request:    Last appt date: 3/25  Previously prescribed on 09/2023    Rx and pharmacy pended    Medication:  apixaban  (Eliquis ) 2.5 MG tablet       Routing to PCP.

## 2024-01-07 ENCOUNTER — Other Ambulatory Visit: Payer: Self-pay

## 2024-01-07 DIAGNOSIS — Z86711 Personal history of pulmonary embolism: Secondary | ICD-10-CM

## 2024-01-07 DIAGNOSIS — E7211 Homocystinuria: Secondary | ICD-10-CM

## 2024-01-25 ENCOUNTER — Ambulatory Visit (INDEPENDENT_AMBULATORY_CARE_PROVIDER_SITE_OTHER)

## 2024-01-25 ENCOUNTER — Other Ambulatory Visit (HOSPITAL_BASED_OUTPATIENT_CLINIC_OR_DEPARTMENT_OTHER): Payer: Self-pay

## 2024-01-25 ENCOUNTER — Encounter (INDEPENDENT_AMBULATORY_CARE_PROVIDER_SITE_OTHER): Payer: Self-pay

## 2024-01-25 VITALS — BP 121/73 | HR 101 | Temp 99.4°F | Resp 17

## 2024-01-25 DIAGNOSIS — N492 Inflammatory disorders of scrotum: Secondary | ICD-10-CM

## 2024-01-25 LAB — CBC (HEMOGRAM)
Hematocrit: 43 % (ref 38.0–50.0)
Hemoglobin: 14.2 g/dL (ref 13.0–18.0)
MCH: 28.3 pg (ref 27.3–33.6)
MCHC: 33.2 g/dL (ref 32.2–36.5)
MCV: 85 fL (ref 81–98)
Platelet Count: 348 10*3/uL (ref 150–400)
RBC: 5.02 10*6/uL (ref 4.40–5.60)
RDW-CV: 12.7 % (ref 11.0–14.5)
WBC: 19.41 10*3/uL — ABNORMAL HIGH (ref 4.3–10.0)

## 2024-01-25 LAB — COMPREHENSIVE METABOLIC PANEL
ALT (GPT): 28 U/L (ref 10–64)
AST (GOT): 25 U/L (ref 9–38)
Albumin: 4.7 g/dL (ref 3.5–5.2)
Alkaline Phosphatase (Total): 48 U/L (ref 39–139)
Anion Gap: 8 (ref 4–12)
Bilirubin (Total): 0.7 mg/dL (ref 0.2–1.3)
Calcium: 9.6 mg/dL (ref 8.9–10.2)
Carbon Dioxide, Total: 29 meq/L (ref 22–32)
Chloride: 99 meq/L (ref 98–108)
Creatinine: 0.93 mg/dL (ref 0.51–1.18)
Glucose: 81 mg/dL (ref 62–125)
Potassium: 3.9 meq/L (ref 3.6–5.2)
Protein (Total): 7.3 g/dL (ref 6.0–8.2)
Sodium: 136 meq/L (ref 135–145)
Urea Nitrogen: 13 mg/dL (ref 8–21)
eGFR by CKD-EPI 2021: >60 mL/min/{1.73_m2} (ref >59)

## 2024-01-25 LAB — C_REACTIVE PROTEIN: C_Reactive Protein: 53.3 mg/L — ABNORMAL HIGH (ref 0.0–10.0)

## 2024-01-25 MED ORDER — CEFADROXIL 500 MG OR CAPS
500.0000 mg | ORAL_CAPSULE | Freq: Two times a day (BID) | ORAL | 0 refills | Status: AC
Start: 2024-01-25 — End: 2024-02-01
  Filled 2024-01-25: qty 14, 7d supply, fill #0

## 2024-01-25 MED ORDER — CEFADROXIL 500 MG OR CAPS
500.0000 mg | ORAL_CAPSULE | Freq: Two times a day (BID) | ORAL | 0 refills | Status: DC
Start: 2024-01-25 — End: 2024-01-25

## 2024-01-25 NOTE — Progress Notes (Signed)
 [facility] Visit    Date of Visit: 01/25/2024    Patient Name: Charles Wagner, he/him/his  Patient MRN: [id number]  Patient DOB: 07-Sep-1975      Charles Wagner is a 48 year old male who presents to clinic for   Chief Complaint   Patient presents with    Infection     Symptoms x24 hours / chills / fever (peak 16f) / fatigue / body aches / vertigo / lightheadedness / symptoms worsening x5 hours / open sore under groin (filled with fluid) x1 week         Use of Ambient Listening:   Was ambient listening technology used during this visit and was verbal consent for recording obtained? Yes, used in visit. Consent was obtained.        ASSESSMENT & PLAN    1. Scrotal abscess (Primary)  -  Patient nontoxic or ill in appearance.  Mildly tachycardic and febrile but all other vitals are stable.  Patient into clinic for evaluation of what he suspects is abscess to the bottom of his scrotum which first appeared approximately 5 days ago.  Reports he has been able to get a small amount of drainage out of it but does not feel size is decreasing.  Upon physical examination patient does have approximately 3.5 cm oval-shaped area of induration, mild erythema, small area of fluctuance with obvious head with yellow drainage. No adenopathy to bilateral groin. Elected to perform incision and drainage and was able to get a small amount of yellow purulent drainage out. I did attempt to deloculate abscess and there is a track going up patients' right side.   I did perform wound culture.  I am going to check CBC, CMP, CRP. Also going to start patient on cefadroxil twice daily for 7 days.  Would like him to do 3-4 warm soaks daily to promote further drainage.  Discussed if any acute worsening in the next 24 to 48 hours would like him to present to ER.  If improving after starting the cefadroxil can RTC in 5 days regardless for recheck.  - Culture Wound, Bact w/ Stain; Future  - Cefadroxil 500 MG capsule; Take 1 capsule  (500 mg) by mouth 2 times a day for 7 days.  Dispense: 14 capsule; Refill: 0  - CBC; Future  - Comprehensive Metabolic Panel; Future  - C-Reactive Protein; Future  - C-Reactive Protein  - Comprehensive Metabolic Panel  - CBC            The above plan was reviewed with the patient at today's visit, all patient questions were answered, and the patient was able to state their understanding.      _____________________________________________________________________    SUBJECTIVE  History of Present Illness  Charles Wagner is a 48 year old male who presents with symptoms suggestive of an infection, including chills, fatigue, body aches, and an open sore to the bottom of his scrotum.  Noticed wound on scrotum approx 5 days ago; maybe from shaving.    Feels symptoms have worsened over the last 5 days. He has had chills for approximately 24 hours, with a recorded temperature of 100F.     He has been cleaning the area diligently. The sore feels fluid-filled and has been sore since its appearance. He reports minimal drainage from the sore and describes it as tender but not painful.          Review of Systems  See HPI  Review of patient's allergies indicates:  No Known Allergies    Past Medical History:   Diagnosis Date    ADHD     Depression     Pulmonary embolism (HCC)        History reviewed. No pertinent surgical history.    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    History of asthma    Hypertriglyceridemia     Multiple subsegmental pulmonary emboli without acute cor pulmonale (HCC)    Recurrent major depression in full remission    Right inguinal hernia         Current Outpatient Medications:     apixaban  (Eliquis ) 2.5 MG tablet, Take 1 tablet (2.5 mg) by mouth 2 times a day., Disp: 180 tablet, Rfl: 3    fluticasone  propionate 50 MCG/ACT nasal spray, Spray 1 spray into each nostril daily., Disp: 11.1 mL, Rfl: 11    Current Facility-Administered Medications:     COVID-19 Moderna mRNA vaccine (Spikevax) injection 50 mcg, 50 mcg, Intramuscular, Once, Halstrom, Vernell Caldron, MD      The following portions of the patient's history were reviewed and updated as appropriate: problem list, current medications, allergies, past medical history, past surgical history, past social history, and past family history.    OBJECTIVE  BP 121/73   Pulse (!) 101   Temp 37.4 C (Oral)   Resp 17   SpO2 99%     BP Readings from Last 3 Encounters:   01/25/24 121/73   09/30/23 124/74   08/04/21 129/81         Vital signs from today's visit have been reviewed and are pertinent for WNL.        Physical Exam  Vitals and nursing note reviewed.   Constitutional:       General: He is not in acute distress.     Appearance: Normal appearance. He is normal weight. He is not ill-appearing, toxic-appearing or diaphoretic.   Pulmonary:      Effort: No respiratory distress.   Genitourinary:     Penis: Normal.       Testes:         Right: Tenderness and swelling present.       Lymphadenopathy:      Lower Body: No right inguinal adenopathy. No left inguinal adenopathy.   Skin:     General: Skin is warm and dry.      Capillary Refill: Capillary refill takes less than 2 seconds.   Neurological:      General: No focal deficit present.      Mental Status: He is alert and oriented to person, place, and time. Mental status is at baseline.   Psychiatric:         Mood and Affect: Mood normal.         Behavior: Behavior normal.  Thought Content: Thought content  normal.         Judgment: Judgment normal.         Incision and Drainage    Date/Time: 01/25/2024 5:24 PM    Performed by: Kingston Pagan, ARNP  Authorized by: Kingston Pagan, ARNP    Consent:     Consent obtained:  Verbal    Consent given by:  Patient    Risks discussed:  Bleeding, incomplete drainage, pain, infection and damage to other organs    Alternatives discussed:  No treatment  Location:     Type:  Abscess    Size:  3.5 cm    Location:  Anogenital    Anogenital location:  Scrotal space  Pre-procedure details:     Skin preparation:  Betadine  Anesthesia (see MAR for exact dosages):     Anesthesia method:  Local infiltration    Local anesthetic:  Lidocaine 1% w/o epi  Procedure type:     Complexity:  Simple  Procedure details:     Incision types:  Stab incision    Incision depth:  Submucosal    Scalpel blade:  11    Wound management:  Probed and deloculated    Drainage:  Purulent and bloody    Drainage amount:  Scant    Wound treatment:  Wound left open    Packing materials:  None  Post-procedure details:     Patient tolerance of procedure:  Tolerated well, no immediate complications            -Pagan Kingston, FNP-C  [facility]          Parts of this note may have been completed using Dragon voice recognition dictation software. It has been reviewed for accuracy but please excuse any transcription errors due to limitations of Animal nutritionist.

## 2024-01-25 NOTE — Patient Instructions (Signed)
-    Warm soaks or baths 4 times daily.  Tylenol  and ibuprofen for pain and inflammation.

## 2024-01-26 LAB — WOUND CULTURE W/GRAM ORDER

## 2024-01-26 NOTE — Result Encounter Note (Signed)
 Prelim culture with no growth. Patient prescribed cefadroxil. Awaiting final.    Hosey CHRISTELLA Evans, MD

## 2024-01-28 ENCOUNTER — Telehealth (INDEPENDENT_AMBULATORY_CARE_PROVIDER_SITE_OTHER)

## 2024-01-28 ENCOUNTER — Encounter (INDEPENDENT_AMBULATORY_CARE_PROVIDER_SITE_OTHER): Payer: Self-pay

## 2024-01-28 DIAGNOSIS — F3342 Major depressive disorder, recurrent, in full remission: Secondary | ICD-10-CM

## 2024-01-28 MED ORDER — BUPROPION HCL ER (XL) 150 MG OR TB24
EXTENDED_RELEASE_TABLET | ORAL | 0 refills | Status: DC
Start: 2024-01-28 — End: 2024-06-18

## 2024-01-28 NOTE — Progress Notes (Signed)
 Preventative Health Care Updates   Since your last visit, please tell us  if you have had any of the below services outside of [facility]:                                                                            Cervical screening/PAP:N/A      Mammo: N/A      Colon Screen: NO   If yes, location/ date.    Specialty Care Updates  Have seen a specialist since your last visit: NO   If yes, Name, location and date.    Any forms to complete today? NO     HM Due:     Health Maintenance Due   Topic    Colorectal Cancer Screening     COVID-19 Vaccine (4 - 2024-25 season)    Depression Monitoring (PHQ-9)              Health Maintenance   Topic Date Due    Colorectal Cancer Screening  Never done    COVID-19 Vaccine (4 - 2024-25 season) 03/31/2023    Depression Monitoring (PHQ-9)  02/22/2024    Hepatitis B Vaccine (3 of 3 - 3-dose series) 09/29/2024 (Originally 08/01/1991)    Prostate Cancer Screening Education  09/29/2025 (Originally 05/27/1976)    Influenza Vaccine (1) 04/29/2024    Diabetes Screening  01/25/2027    DTaP, Tdap and Td Vaccines (3 - Td or Tdap) 04/16/2027    Lipid Disorders Screening  09/29/2028    HIV Screening  Completed    Hepatitis A Vaccine  Aged Out    Meningococcal B Vaccine  Aged Out    Pneumococcal Vaccine: Pediatrics (0-5 years) and At-Risk Patients (6-49 years)  Aged Out    HPV Vaccine  Aged Out    Hepatitis C Screening  Discontinued         Future Appointments   Date Time Provider Department Center   01/28/2024  9:20 AM Halstrom, Vernell Caldron, MD [facility]     Answers submitted by the patient for this visit:  Health Screening: Depression (Submitted on 01/23/2024)  PHQ9 SCORE: 19  Health Screening: Anxiety (Submitted on 01/23/2024)  GAD7 SCORE: 11

## 2024-01-28 NOTE — Progress Notes (Signed)
 Distant Site Telemedicine Encounter  I conducted this encounter via secure, live, face-to-face video conference with the patient. I reviewed the risks and benefits of telemedicine as pertinent to this visit and the patient agreed to proceed.    Provider Location: On-site location (clinic, hospital, on-site office)  Patient Location: At work    Present with patient: No one else present         Chief Complaint   Patient presents with    Medication Management     Antidepressants          Subjective:     Charles Wagner is a 48 year old male who presents on 01/28/2024.    Study ended end of may, has been off for four weeks. Has had issues since then. He is back to classic deprssion-apathy, slow moving, loss of concentration, negative thoughts. Was on buporprion prior to study which was ok. Lexapro had side effects-mostly sexual side effects. Phq9 19. Had been told he could try duloxetine by study people. His therapist retired.   Abscess is getting better, leaking more fluid, fever broke.        Objective:    Vitals: There were no vitals taken for this visit.  Physical Exam  Constitutional:       General: He is not in acute distress.     Appearance: Normal appearance.   HENT:      Head: Normocephalic and atraumatic.   Eyes:      General: No scleral icterus.     Extraocular Movements: Extraocular movements intact.      Conjunctiva/sclera: Conjunctivae normal.   Pulmonary:      Effort: Pulmonary effort is normal. No respiratory distress.   Neurological:      General: No focal deficit present.      Mental Status: He is alert and oriented to person, place, and time.   Psychiatric:         Mood and Affect: Mood normal.         Behavior: Behavior normal.              Assessment and Plan:      Kohei was seen today for medication management.    Recurrent major depression in full remission  Trial buprroprion, will inc to max dose. Discussed safety plan should things worsen. Rtc 6 weeks  -     buPROPion XL 150 MG 24 hr tablet; Take 1  tablet (150 mg) by mouth daily for 14 days, THEN 2 tablets (300 mg) daily for 30 days, THEN 3 tablets (450 mg) daily.  Dispense: 164 tablet; Refill: 0    Abscess    - encouraged fu and discussed alarm signs and return precautions       Vernell Head, MD  Internal Medicine Physician   [facility], [facility]    Answers submitted by the patient for this visit:  Health Screening: Depression (Submitted on 01/23/2024)  PHQ9 SCORE: 19  Health Screening: Anxiety (Submitted on 01/23/2024)  GAD7 SCORE: 11

## 2024-01-29 ENCOUNTER — Other Ambulatory Visit (INDEPENDENT_AMBULATORY_CARE_PROVIDER_SITE_OTHER): Payer: Self-pay | Admitting: Family Practice

## 2024-01-29 DIAGNOSIS — N492 Inflammatory disorders of scrotum: Secondary | ICD-10-CM

## 2024-01-29 LAB — WOUND C/S W/GRAM (ANAEROBIC)
Culture: NO GROWTH — AB
Gram Smear: NONE SEEN
Gram Smear: NONE SEEN
Gram Smear: NONE SEEN

## 2024-01-29 MED ORDER — SULFAMETHOXAZOLE-TRIMETHOPRIM 800-160 MG OR TABS
1.0000 | ORAL_TABLET | Freq: Two times a day (BID) | ORAL | 0 refills | Status: AC
Start: 2024-01-29 — End: 2024-02-05

## 2024-01-29 NOTE — Result Encounter Note (Signed)
 CALL patient    Final wound culture is growing Staph aureus that is resistant to cefadroxil . Recommend the following:    Stop cefadroxil   Start trimethoprim /sulfamethoxazole  1 double strength tab by mouth every 12 hours for 7 days  return to clinic as previously recommended. If symptoms are worsening, recommend being seen immediately in the emergency department.    Prescription sent to [facility], [place]

## 2024-05-27 ENCOUNTER — Telehealth (HOSPITAL_BASED_OUTPATIENT_CLINIC_OR_DEPARTMENT_OTHER): Payer: Self-pay | Admitting: Gastroenterology

## 2024-05-27 DIAGNOSIS — Z1211 Encounter for screening for malignant neoplasm of colon: Secondary | ICD-10-CM

## 2024-05-27 MED ORDER — PEG 3350-KCL-NABCB-NACL-NASULF 236 G OR SOLR
ORAL | 0 refills | Status: DC
Start: 2024-05-27 — End: 2024-06-18

## 2024-05-27 MED ORDER — NA SULFATE-K SULFATE-MG SULF 17.5-3.13-1.6 GM/177ML OR SOLN
ORAL | 0 refills | Status: DC
Start: 2024-05-27 — End: 2024-06-09

## 2024-05-27 NOTE — Telephone Encounter (Signed)
 Hello All,     The following patient has been scheduled for a colonoscopy.      MRN: [id number]  DATE AND TIME: 07/06/2025  0900  REFERRAL NUMBER: [id number]  PROVIDER: Hujoel, Isabel Angela, MD   PHONE NUMBER:   Home Phone [phone]   Work Phone Not on file.   Mobile [phone]      Prior colonoscopy procedure?: NO  Diabetic?: NO  Are you using any blood thinners or anticoagulation medications such as   (ensure you are mentioning brand and generic name to patient)       -Coumadin (warfarin, war-furr-in)   NO       -Plavix (clopidogrel, kloh-pid-oh-grel) NO       -Fragmin (dalteparin, dall-tep-arin) NO       -Effient (prasugrel, prah-soo-grel) NO       -Lovenox(enoxaparin, e-nox-a-pare-in) NO       -Xarelto (rivaroxaban, reev-a-rox-a-ban) NO       -Pradaxa (dabigatran, da-bye-ga-tran) NO       -Eliquis  (apixaban , a-pix-a-ban) YES  (If yes to above) What is the name of the doctor who prescribes your blood thinners: Dr. Clarisse  Are you allergic to either of the following medications?      -Versed (a sedative): NO      -Fentanyl (a narcotic pain reliever): NO  Have you been diagnosed with sleep apnea?: NO  Have you been told you're a difficult IV to start?: NO  Currently experiencing constipation lasting longer than 3 days?: NO  Do you have any mobility restrictions, such as a cane, walker, or wheelchair, where you might need assistance transferring to a stretcher?: NO     Chronic health condition(s) that impact scheduling:                 End Stage Kidney or End Stage Liver Disease: NO  Currently on Kidney Dialysis: NO  Do you use supplemental oxygen at home?: NO   Diagnosed with a bleeding disorder? (e.g. Hemophilia or low platelet count [aka thrombocytopenia]): NO  Congestive heart failure, pacemaker, or implantable defibrillator: NO  On a daily basis, do you use narcotics, alcohol/beer, or recreational drugs (including marijuana)?:  NO              Drinks: N/A daily (1-2 drinks, ok to schedule)               Narcotics: NO daily              Recreational Drugs: NO daily (Marijuana ok)  Any issues with moderate sedation in the past (difficulty waking up, allergic reactions, difficulty breathing): NO  Have you received an organ transplant?: NO  Currently pregnant? (only ask if male and under the age of 44): NO  Are you taking any medications for diabetes or weight loss such as the ones below?   Dulaglutide (Trulicity) NO  Exenatide extended release (Bydureon) NO  Exenatide (Byetta) NO  Liraglutide (Victoza, Saxenda) NO  Lixisenatide (Adlyxin) NO  Semaglutide (Ozempic, Rybelsus, Wegovy) NO  Tirzepatide (Mounjaro, Zepbound) NO  If patient is taking any medications please advise of the following:              -Follow a clear liquid diet for 24 hours prior to the procedure              -Hold dosage on day of procedure to take after the procedure     Estimated body mass index is 26.71 kg/m as calculated from the following:    Height  as of 09/30/23: 6' 2 (1.88 m).    Weight as of 09/30/23: 94.3 kg (208 lb).     Patient's Preferred Pharmacy Location: [facility] [address] [place], [place], [place] [address]   Additional prep notes:  E-mail address: [email or web address]         Patient advised of escort: YES  Name of escort: Norleen  Are you able to provide consent for your own procedure?: YES  Patient advised on how to receive prep instructions:  YES        Thank You,  Norberto L  Referral Team        **PLEASE NOTE**     When replying to this message,  You must CC the following pool:     P [facility] SCHEDULING LIASON     Or the referral team will not receive the message

## 2024-05-27 NOTE — Telephone Encounter (Signed)
 Patient is scheduled. Sent to fc for prior auth, pended RX to provider to sign, Patient scheduled at [facility], Sent prep/appt information to patients mychart.

## 2024-05-29 ENCOUNTER — Encounter (INDEPENDENT_AMBULATORY_CARE_PROVIDER_SITE_OTHER): Payer: Self-pay

## 2024-06-01 ENCOUNTER — Encounter (INDEPENDENT_AMBULATORY_CARE_PROVIDER_SITE_OTHER): Payer: Self-pay

## 2024-06-06 ENCOUNTER — Ambulatory Visit: Payer: Self-pay

## 2024-06-06 NOTE — Telephone Encounter (Signed)
 Patient identity confirmed x3 HIPAA identifiers.     Patient called to voice complaint of new worsening vertigo.    Patient reports he has had vertigo this past week. Patient states this has been happening daily at least once daily, usually lasting less than 30 seconds. He had a 4 hour long period with vertigo. Patient describes as a zap in his brain like a lightening bolt and then the room spinning. Patient states zap is not a painful feeling like a headache, feels like a brain scramble. Patient states he is still alert and oriented when this is happening able to maintain conversation and thoughts. Has balance issues when room is spinning. Has chronic migraines, 1-2 per month. Patient is on blood thinner due to clotting disorder.    Patient advised to see physician within 24 hours per Dizziness - Vertigo-ADULT-AH protocol, patient verbalized understanding.    Patient was advised to call back for any new or worsening symptoms or further questions, patient verbalized understanding.      Reason for Disposition   [1] MODERATE dizziness (e.g., vertigo; feels very unsteady, interferes with normal activities) AND [2] has NOT been evaluated by physician for this    Additional Information   Negative: Followed a head injury   Negative: Followed an ear injury   Negative: Localized weakness or numbness is main symptom   Negative: Dizziness relates to riding in a car, going to an amusement park, etc.   Negative: [1] Dizziness is main symptom AND [2] NO spinning sensation (i.e., vertigo)   Negative: [1] Weakness (i.e., paralysis, loss of muscle strength) of the face, arm or leg on one side of the body AND [2] sudden onset AND [3] present now   Negative: [1] Numbness (i.e., loss of sensation) of the face, arm or leg on one side of the body AND [2] sudden onset AND [3] present now   Negative: [1] Loss of speech or garbled speech AND [2] sudden onset AND [3] present now   Negative: Difficult to awaken or acting confused (e.g.,  disoriented, slurred speech)   Negative: Sounds like a life-threatening emergency to the triager   Negative: SEVERE dizziness (vertigo) (e.g., unable to walk without assistance)   Negative: Severe headache (e.g., excruciating)  (Exception: similar to previous migraines)   Negative: [1] Dizziness (vertigo) present now AND [2] one or more STROKE RISK FACTORS (i.e., hypertension, diabetes, prior stroke/TIA, heart attack)  (Exception: prior physician evaluation for this AND no different/worse than usual)   Negative: [1] Dizziness (vertigo) present now AND [2] age > 30  (Exception: prior physician evaluation for this AND no different/worse than usual)   Negative: [1] Weakness (i.e., paralysis, loss of muscle strength) of the face, arm / hand, or leg / foot on one side of the body AND [2] sudden onset AND [3] brief (now gone)   Negative: [1] Numbness (i.e., loss of sensation) of the face, arm / hand, or leg / foot on one side of the body AND [2] sudden onset AND [3] brief (now gone)   Negative: [1] Loss of speech or garbled speech AND [2] sudden onset AND [3] brief (now gone)   Negative: Loss of vision or double vision (Exception: similar to previous migraines)   Negative: Patient sounds very sick or weak to the triager   Negative: Taking a medicine that could cause dizziness (e.g., phenytoin [Dilantin], carbamazepine [Tegretol], primidone [Mysoline])    Answer Assessment - Initial Assessment Questions  1. DESCRIPTION: Describe your dizziness.  Patient describes as room spinning, and balance is off.   2. VERTIGO: Do you feel like either you or the room is spinning or tilting?       Room is spinning  3. LIGHTHEADED: Do you feel lightheaded? (e.g., somewhat faint, woozy, weak upon standing)      Feels lightheaded, does get lightheaded when going from sitting to standing when outside only. Also happens with exertion.  4. SEVERITY: How bad is it?  Can you walk?    - MILD: Feels slightly dizzy and unsteady, but  is walking normally.    - MODERATE: Feels unsteady when walking, but not falling; interferes with normal activities (e.g., school, work).    - SEVERE: Unable to walk without falling, or requires assistance to walk without falling.      When symptomatic is moderate to severe.  5. ONSET:  When did the dizziness begin?      Initial vertigo happened approximately a year ago. Worsening over the past week.   6. AGGRAVATING FACTORS: Does anything make it worse? (e.g., standing, change in head position)      Patient cannot name a specific trigger or aggravating factor.  7. CAUSE: What do you think is causing the dizziness?      Patient is unsure.  8. RECURRENT SYMPTOM: Have you had dizziness before? If Yes, ask: When was the last time? What happened that time?      1 year ago.  9. OTHER SYMPTOMS: Do you have any other symptoms? (e.g., headache, weakness, numbness, vomiting, earache)      Patient denies but says he did have nausea.  10. PREGNANCY: Is there any chance you are pregnant? When was your last menstrual period?        N/A    Protocols used: Dizziness - Vertigo-ADULT-AH

## 2024-06-07 ENCOUNTER — Ambulatory Visit (INDEPENDENT_AMBULATORY_CARE_PROVIDER_SITE_OTHER): Admitting: Family Medicine

## 2024-06-07 ENCOUNTER — Other Ambulatory Visit (INDEPENDENT_AMBULATORY_CARE_PROVIDER_SITE_OTHER): Payer: Self-pay | Admitting: Family Medicine

## 2024-06-07 ENCOUNTER — Encounter (INDEPENDENT_AMBULATORY_CARE_PROVIDER_SITE_OTHER): Payer: Self-pay | Admitting: Family Medicine

## 2024-06-07 VITALS — BP 115/61 | HR 72 | Temp 98.1°F | Resp 16

## 2024-06-07 DIAGNOSIS — L02416 Cutaneous abscess of left lower limb: Secondary | ICD-10-CM

## 2024-06-07 DIAGNOSIS — L03116 Cellulitis of left lower limb: Secondary | ICD-10-CM

## 2024-06-07 MED ORDER — MUPIROCIN 2 % EX OINT
TOPICAL_OINTMENT | Freq: Two times a day (BID) | CUTANEOUS | 0 refills | Status: DC
Start: 2024-06-07 — End: 2024-06-18

## 2024-06-07 MED ORDER — CEFADROXIL 500 MG OR CAPS
500.0000 mg | ORAL_CAPSULE | Freq: Two times a day (BID) | ORAL | 0 refills | Status: DC
Start: 2024-06-07 — End: 2024-06-12

## 2024-06-07 NOTE — Patient Instructions (Signed)
 Patient comes in with cellulitis of the left lower extremity that has been worsening over the last 3 days.  Denies any fever, trauma or puncture.  On physical exam he has a large area of erythema in the lateral aspect of the left leg with central pustular lesion.  This pustular lesion was drained without any need of antibiotics.  If it is still ointment was applied.  I give him a prescription for an oral antibiotic in case that his symptoms are getting worse.  Otherwise he should apply mupirocin ointment daily until complete improvement.  If he develops any fever or worsening erythema, he should go to the emergency department.

## 2024-06-07 NOTE — Progress Notes (Signed)
 Subjective:   Charles Wagner is a 48 year old male presenting for   Chief Complaint   Patient presents with    Wound Infection     1. Noticed 3-days ago; wound on L-calf which has been hurting. States it is now swollen and there is constant pain - feels like the wound is full of something. Looks red and inflamed       Patient comes in for an expanding cellulitis and tenderness in left lower extremity for about 3 days.  He denies any bug bites, trauma or punctures.    Review of Systems:   Review of Systems   Musculoskeletal:  Negative for arthralgias, back pain, gait problem, joint swelling and myalgias.   Skin:  Positive for color change and rash. Negative for pallor and wound.       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 180 tablet 3    buPROPion  XL 150 MG 24 hr tablet Take 1 tablet (150 mg) by mouth daily for 14 days, THEN 2 tablets (300 mg) daily for 30 days, THEN 3 tablets (450 mg) daily. (Patient not taking: No sig reported) 164 tablet 0    fluticasone  propionate 50 MCG/ACT nasal spray Spray 1 spray into each nostril daily. (Patient not taking: Reported on 06/07/2024) 11.1 mL 11    Na sulfate-K sulfate-Mg sulfate (Suprep Bowel Prep Kit) solution Take as instructed for colonoscopy prep. (Patient not taking: Reported on 06/07/2024) 354 mL 0    polyethylene glycol 3350 with electrolytes 236 g solution Take per instructions provided for  colonoscopy prep (Patient not taking: Reported on 06/07/2024) 4000 mL 0     Facility-Administered Medications Prior to Visit   Medication Dose Route Frequency Provider Last Rate Last Admin    COVID-19 Moderna mRNA vaccine (Spikevax) injection 50 mcg  50 mcg Intramuscular Once Halstrom, Vernell Caldron, MD         Review of patient's allergies indicates:  No Known Allergies  Social History[3]      Objective:   BP 115/61   Pulse 72   Temp 36.7 C (Temporal)   Resp 16   SpO2 98%     Physical Exam        Incision and Drainage    Date/Time: 06/07/2024 10:20 AM    Performed by: Earma Millard Merck, MD  Authorized by: Earma Millard Merck, MD    Consent:     Consent obtained:  Verbal    Consent given by:  Patient    Risks discussed:  Bleeding  Location:     Type:  Abscess    Location:  Lower extremity    Lower extremity location:  Leg    Leg location:  L lower leg  Pre-procedure details:     Skin preparation:  Antiseptic wash  Anesthesia (see MAR for exact dosages):  Anesthesia method:  None  Procedure type:     Complexity:  Simple  Procedure details:     Needle aspiration: no      Incision types:  Stab incision    Incision depth:  Dermal    Scalpel blade:  15    Wound management:  Probed and deloculated    Drainage:  Purulent    Drainage amount:  Scant    Wound treatment:  Wound left open    Packing materials:  None  Post-procedure details:     Patient tolerance of procedure:  Tolerated well, no immediate complications      Assessment / Plan       ICD-10-CM    1. Cellulitis and abscess of left lower extremity  L03.116 Cefadroxil  500 MG capsule    L02.416 Culture Wound, Bact w/ Stain     mupirocin 2 % ointment          No LOS data to display      Patient Instructions   Patient comes in with cellulitis of the left lower extremity that has been worsening over the last 3 days.  Denies any fever, trauma or puncture.  On physical exam he has a large area of erythema in the lateral aspect of the left leg with central pustular  lesion.  This pustular lesion was drained without any need of antibiotics.  If it is still ointment was applied.  I give him a prescription for an oral antibiotic in case that his symptoms are getting worse.  Otherwise he should apply mupirocin ointment daily until complete improvement.  If he develops any fever or worsening erythema, he should go to the emergency department.         [1]   Past Medical History:  Diagnosis Date    ADHD     Depression     Pulmonary embolism (HCC)    [2] History reviewed. No pertinent surgical history.  [3]   Social History  Tobacco Use    Smoking status: Former     Current packs/day: 0.00     Average packs/day: 0.5 packs/day for 10.0 years (5.0 ttl pk-yrs)     Types: Cigarettes     Start date: 2000     Quit date: 2010     Years since quitting: 15.8     Passive exposure: Past    Smokeless tobacco: Never   Substance Use Topics    Alcohol use: Yes     Alcohol/week: 1.0 - 6.0 standard drink of alcohol     Types: 1 - 6 Standard drinks or equivalent per week    Drug use: Never

## 2024-06-08 ENCOUNTER — Ambulatory Visit (INDEPENDENT_AMBULATORY_CARE_PROVIDER_SITE_OTHER): Payer: Self-pay | Admitting: Family Medicine

## 2024-06-08 DIAGNOSIS — L02416 Cutaneous abscess of left lower limb: Secondary | ICD-10-CM

## 2024-06-08 LAB — WOUND CULTURE W/GRAM ORDER

## 2024-06-09 LAB — WOUND C/S W/GRAM: Gram Smear: NONE SEEN

## 2024-06-09 MED ORDER — SULFAMETHOXAZOLE-TRIMETHOPRIM 800-160 MG OR TABS
1.0000 | ORAL_TABLET | Freq: Two times a day (BID) | ORAL | 0 refills | Status: AC
Start: 2024-06-09 — End: 2024-06-16

## 2024-06-10 ENCOUNTER — Encounter: Payer: Self-pay | Admitting: Physician Assistant

## 2024-06-10 ENCOUNTER — Telehealth: Admitting: Physician Assistant

## 2024-06-10 DIAGNOSIS — Z7901 Long term (current) use of anticoagulants: Secondary | ICD-10-CM

## 2024-06-10 DIAGNOSIS — R42 Dizziness and giddiness: Secondary | ICD-10-CM

## 2024-06-10 DIAGNOSIS — G43109 Migraine with aura, not intractable, without status migrainosus: Secondary | ICD-10-CM

## 2024-06-10 DIAGNOSIS — Z86711 Personal history of pulmonary embolism: Secondary | ICD-10-CM

## 2024-06-10 NOTE — Progress Notes (Signed)
 ID verified in media  Patient verified they are in [place] State: YES    Registration items confirmed/updated: Insurance, Address, and Phone Number      Items reconciled: Allergies and N/A - No items flagged for reconciliation   Questionnaires available:N/A  Relevant records: N/A  Photos Requested: N/A  Smoking History completed: N/A    Vitals:                          Charles Wagner, CMA  [facility]

## 2024-06-10 NOTE — Patient Instructions (Signed)
 VISIT SUMMARY:  Today, you were seen for vertigo and brain zaps. You described these episodes as a zap in your brain that scrambles your thoughts, causing lightheadedness and a spinning sensation. These episodes have increased in frequency and sometimes last for several hours. You also have a history of migraines, pulmonary embolism, and ADHD. You recently discontinued Wellbutrin  abruptly, which may be contributing to your symptoms.    YOUR PLAN:  -VERTIGO AND BRAIN ZAPS: Vertigo is a sensation of spinning or dizziness, and brain zaps are sudden electrical shock-like sensations in the brain. Your symptoms may be related to stopping your antidepressant medication suddenly. You should discuss these symptoms with your primary care physician and consider a neurological exam. Seek emergency care if your symptoms worsen.    -ANTIDEPRESSANT DISCONTINUATION SYNDROME: Antidepressant discontinuation syndrome can occur when you stop taking an antidepressant medication suddenly. This may be contributing to your vertigo and brain zaps. You should discuss your medication management with your primary care physician and consider a follow-up evaluation.    -MIGRAINE: A migraine is a type of headache that can cause severe pain, often accompanied by nausea and sensitivity to light and sound. You have been experiencing frequent migraines for over ten years and have not seen a neurologist. It is recommended that you consult with a neurologist for further evaluation and management.    -HISTORY OF PULMONARY EMBOLISM, ON ANTICOAGULATION: A pulmonary embolism is a blockage in one of the pulmonary arteries in your lungs. You are currently taking Eliquis  to prevent further clots. Continue taking Eliquis  as prescribed.    INSTRUCTIONS:  Please follow up with your primary care physician to discuss your vertigo, brain zaps, and medication management. Consider scheduling a neurological exam and a consultation with a neurologist for your  migraines. Seek emergency care if your symptoms worsen.          Here are a few tips to help navigate your healthcare needs:     Urgent Symptoms: Call [phone] day or night. Our triage nurses can help you.   Urgent Care: There are several [place] urgent care clinics that you can walk into.  Test Results: Most results are available within 1-2 weeks. We will contact you through MyChart or by letter unless there is something urgent, in which case we will contact you sooner.  Refills: Call your pharmacy at least 5 working days before you run out. It is quicker and safer to go through your pharmacy.   Other Questions: Use MyChart to securely message us . Please note that e-care messages are only read during office hours. If you have a long or complex question or a new issue, I can recommend the following options: initiate an  on-demand video via MyChart or send a message via MyChart to request a visit with the virtual team at a time that works best for you between 10 am and 6 pm. You can also contact your primary care provider's office to follow up there.    Thank you for choosing [facility].      If you have any questions or concerns related to today's visit, please send a MyChart message or contact our patient line at [phone].

## 2024-06-10 NOTE — Telephone Encounter (Signed)
 Appointment today 06/10/24 with [facility]   Front desk: can offer FU next week with PCP or flex provider

## 2024-06-10 NOTE — Progress Notes (Signed)
 The patient's identification was verified by using two patient identifiers and the following: confirmed by MA during rooming process.    Use of Ambient Listening:   Was ambient listening technology used during this visit and was verbal consent for recording obtained? Yes, used in visit. Consent was obtained.    Distant Site Telemedicine Encounter  I conducted this encounter via secure, live, face-to-face video conference with the patient. I reviewed the risks and benefits of telemedicine as pertinent to this visit and the patient agreed to proceed.    Provider Location: Off-site location (home, [place] location)  Patient Location: At home    Present with patient: No one else present     Chief Complaint   Patient presents with    Dizziness     vertigo        Subjective:       History of Present Illness  Charles Wagner is a 48 year old male who presents with vertigo and brain zaps.    He experiences episodes of vertigo described as 'a zap in my brain' that scrambles his thoughts, causing lightheadedness and a spinning sensation. These episodes typically last a few seconds but have recently increased in frequency, occurring multiple times a day. Last week, he experienced a prolonged episode lasting several hours, during which he felt off balance and had to hold onto furniture to steady himself. During these episodes, he maintains full cognitive function and awareness of his surroundings. No loss of consciousness, history of concussion, or head injury. He reports nausea during prolonged vertigo episodes.    He has a history of migraines occurring at least once a month for the past ten years, varying from a dull ache to severe episodes requiring him to isolate in a dark room. He has not seen a neurologist for these symptoms.    He has a history of pulmonary embolism diagnosed in January 2023, for which he is taking Eliquis . He started a trial for a depression medication in May 2024, which ended in May 2024. He was on  Wellbutrin , 450 mg, which he discontinued abruptly a few months ago.    He experienced chronic insomnia in March, which has since resolved, and reports his sleep is currently consistent. He does not feel he is under more stress than usual.    He has a history of ADHD, previously managed with medication, but currently manages symptoms through behavioral strategies.   He was on Wellbutrin  for depression, which was initially prescribed to complement ADHD medication, but he has not found it effective and has not renewed the prescription after completing the trial.       Objective:    Vitals: There were no vitals taken for this visit.  General: well appearing, no acute distress  HEENT: normocephalic/atraumatic, EOMI  Pulmonary: speaking in full sentences  Psych: mood normal, behavior normal  Neuro: alert and oriented x 3  Physical Exam              Assessment and Plan:      Assessment & Plan  Vertigo   Intermittent vertigo and brain zaps for one year, increased frequency. 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  - Discuss symptoms with primary care physician.  - Consider neurological exam.  - Seek emergency care if symptoms worsen.    Antidepressant discontinuation syndrome  Recent higher dose Wellbutrin  discontinuation without tapering may contribute to symptoms.  - Discuss medication management with primary care physician.  - Consider follow-up for evaluation.    Migraine  Frequent migraines for over ten years, no neurology consultation.    History of pulmonary embolism, on anticoagulation  Pulmonary embolism in January 2023, on Eliquis .  - Continue Eliquis  as prescribed.        There are limitations to telemedicine and follow up is always recommended if symptoms persist or do not improve.  A treatment plan was created through shared decision making after discussion of risks and benefits of treatment options. Patient/family understands and agrees with plan. Left in stable condition.     I reviewed patients  available records prior to the visit including, but not limited to previous visits with primary care, applicable specialist care, allergies, PMH and medications.         I spent 20 minutes on this encounter on 06/10/2024.                  Please Note:  To patients reading this note: Please be advised that the primary purpose of this note is for me to communicate with myself and other members of your medical team. Standard sentence structure is not always used. Medical terminology and medical abbreviations may be used. Parts of this note may have been generated with voice dictation software. Therefore, there may be wrong word or sound alike substitution errors and typographical errors missed in proofreading. Attempts to correct the above have been made by the provider, but it is recommended that the chart be read carefully to recognize, using context, where the substitutions may have occurred.

## 2024-06-17 ENCOUNTER — Encounter (INDEPENDENT_AMBULATORY_CARE_PROVIDER_SITE_OTHER): Admitting: Family Medicine

## 2024-06-17 ENCOUNTER — Other Ambulatory Visit (INDEPENDENT_AMBULATORY_CARE_PROVIDER_SITE_OTHER): Payer: Self-pay | Admitting: Family Medicine

## 2024-06-18 ENCOUNTER — Telehealth (INDEPENDENT_AMBULATORY_CARE_PROVIDER_SITE_OTHER)

## 2024-06-18 DIAGNOSIS — H8190 Unspecified disorder of vestibular function, unspecified ear: Secondary | ICD-10-CM

## 2024-06-18 NOTE — Progress Notes (Signed)
 Distant Site Telemedicine Encounter  I conducted this encounter via secure, live, face-to-face video conference with the patient. I reviewed the risks and benefits of telemedicine as pertinent to this visit and the patient agreed to proceed.    Provider Location: Off-site location (home, non-[place] location)  Patient Location: At work    Present with patient: No one else present         Chief Complaint   Patient presents with    Follow-Up      Did not find wellbutrin  helpful , just signed up for new depression study drug.   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 Now back this month.           Subjective:     Charles Wagner is a 48 year old male who presents on 06/18/2024.    Typically vertigo is a zap, minutes gone. Not positional, was sitting in office. Then two weeks ago happened for hours consistently and then went away.   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 Also had it happen again in car. Has migraines 1-2 a month with varying degrees of severit- typically can go away with excedrin and coffee, has been going on for years. Did not try this. Feels like he's on a boat.     No recent uri. No ear symptoms.      Objective:    Vitals: There were no vitals taken for this visit.  Physical Exam  Constitutional:       General: He is not in acute distress.     Appearance: Normal appearance.   HENT:      Head: Normocephalic and atraumatic.   Eyes:      General: No scleral icterus.     Extraocular Movements: Extraocular movements intact.      Conjunctiva/sclera: Conjunctivae normal.   Pulmonary:      Effort: Pulmonary effort is normal. No respiratory distress.   Neurological:      General: No focal deficit present.      Mental Status: He is alert and oriented to person, place, and time.   Psychiatric:         Mood and Affect: Mood normal.         Behavior: Behavior normal.               Assessment and Plan:      Charles Wagner was seen today for follow-up .    Episodic recurrent vertigo  Broad differential, does not sound c/w bppv given lack of positional trigger, length of most recent episode concerning, intermittent nature does not fit neuritis, could be vestibular migraine. Low threshold to order mri and neuro eval however would like in person exam for neuro exam prior to sending. Will send message to front desk ot schedule with flex if able.               Vernell Head, MD  Internal Medicine Physician   [facility], [facility]

## 2024-06-19 ENCOUNTER — Encounter (INDEPENDENT_AMBULATORY_CARE_PROVIDER_SITE_OTHER): Payer: Self-pay | Admitting: Family Medicine

## 2024-06-19 ENCOUNTER — Ambulatory Visit (INDEPENDENT_AMBULATORY_CARE_PROVIDER_SITE_OTHER): Admitting: Family Medicine

## 2024-06-19 VITALS — BP 138/89 | HR 62 | Temp 98.2°F | Resp 16 | Wt 204.4 lb

## 2024-06-19 DIAGNOSIS — L03116 Cellulitis of left lower limb: Secondary | ICD-10-CM

## 2024-06-19 DIAGNOSIS — L02416 Cutaneous abscess of left lower limb: Secondary | ICD-10-CM

## 2024-06-19 DIAGNOSIS — F3342 Major depressive disorder, recurrent, in full remission: Secondary | ICD-10-CM

## 2024-06-19 MED ORDER — CLINDAMYCIN HCL 300 MG OR CAPS
300.0000 mg | ORAL_CAPSULE | Freq: Four times a day (QID) | ORAL | 0 refills | Status: AC
Start: 2024-06-19 — End: 2024-06-26

## 2024-06-19 NOTE — Progress Notes (Unsigned)
 Chief Complaint   Patient presents with    Follow Up Visit     Left leg infection, started a new depression drug, unsure of it's name, no records in EPIC     PCP: Clarisse, Vernell Caldron, MD    Use of Ambient Listening:   Was ambient listening technology used during this visit and was verbal consent for recording obtained? Yes, used in visit. Consent was obtained.       Subjective :{Jump to Allergies  Medications  Immunizations  Behavioral Health Screenings  HM  Smartsets  PMP Review  Problem List  History  :999}      History of Present Illness  Charles Wagner is a 48 year old male who presents for follow-up of a left leg infection.    Approximately twelve days ago, he sought care at an urgent care facility for a worsening condition over three days, where he was diagnosed with cellulitis and an abscess on the left lower extremity. An incision and drainage procedure was performed at that time.    He was initially prescribed cefadroxil  capsules, taken twice daily for five days, which did not improve his condition. Subsequently, he was prescribed Bactrim , taken twice daily for seven days. He reports that the infection has improved, with a significant reduction in swelling, and the wound culture showed susceptibility to Bactrim . However, he notes a persistent pocket that continues to leak, although it is improving daily.    He is currently participating in a clinical trial for depression, managed by a separate clinic. He acknowledges having thoughts of self-harm, which is why he is in the trial, but denies any active plans or immediate safety concerns.     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       Vitals: BP 138/89 Comment: caffiene prior  Pulse 62   Temp 36.8 C (Temporal)   Resp 16   Wt 92.7 kg (204 lb 6.4 oz)   SpO2 98%   BMI 26.24 kg/m   Physical Exam  EXTREMITIES: Left lower extremity, lower leg lateral, with erythematous edematous area about 8 cm to visualization with flunctuance.    Procedure note:  Verification of patient identity confirmed.  Consent obtained.  Risks of procedure discussed with pt.  The patient was advised of possible complications including but not limited to bleeding, recurrence, pain, and scarring.  Questions answered.  Area cleaned with betadine and rubbing alcohol.  Anesthesia with 1% lidocaine with epi approximately 4 mL administered.  4 mm punch used to obtain incision and drainage.Serosanguinous  fluid was expressed and drained with pressure.  Area packed, cleaned and dressed.  Patient tolerated procedure well.      {Last Common Labs (Optional):132608}  {Last Imaging Results, 1 yr lookback (Optional):132609}        Assessment & Plan .{Jump to Problem List  Meds  HM  Care Gaps  Pt Instructions :999}      Assessment & Plan  Left lower extremity cellulitis with abscess  Cellulitis and abscess in the left lower extremity, initially treated with incision and drainage followed by cefadroxil  and Bactrim . The infection has improved with reduced swelling, but a persistent pocket is leaking and firm, indicating the need for further intervention.  - I&D performed today  and pt tolerated procedure well  - Clindamycin  for additional coverage.  - Advised to go to the ER if the condition does not improve after the procedure.    Major depressive disorder  Ongoing management through a trial at the clinic. He reports active thoughts of self-harm but denies any active plans or immediate risk, feeling safe at present.  - Continue management through the clinic trial.    Orders Placed This Encounter    clindamycin  300 MG capsule     Sig: Take 1 capsule (300 mg) by mouth 4 times a day for 7 days.     Dispense:  28 capsule     Refill:  0

## 2024-06-22 ENCOUNTER — Encounter (HOSPITAL_BASED_OUTPATIENT_CLINIC_OR_DEPARTMENT_OTHER): Payer: Self-pay

## 2024-07-03 ENCOUNTER — Encounter (INDEPENDENT_AMBULATORY_CARE_PROVIDER_SITE_OTHER): Payer: Self-pay | Admitting: Gastroenterology

## 2024-07-06 ENCOUNTER — Encounter (HOSPITAL_BASED_OUTPATIENT_CLINIC_OR_DEPARTMENT_OTHER): Payer: Self-pay | Admitting: Gastroenterology

## 2024-07-06 ENCOUNTER — Ambulatory Visit
Admission: RE | Admit: 2024-07-06 | Discharge: 2024-07-06 | Disposition: A | Discharge status: Home / Self Care | Attending: Gastroenterology | Admitting: Gastroenterology

## 2024-07-06 DIAGNOSIS — K635 Polyp of colon: Secondary | ICD-10-CM

## 2024-07-06 DIAGNOSIS — Z1211 Encounter for screening for malignant neoplasm of colon: Secondary | ICD-10-CM | POA: Insufficient documentation

## 2024-07-06 DIAGNOSIS — D122 Benign neoplasm of ascending colon: Secondary | ICD-10-CM

## 2024-07-06 MED ORDER — NALOXONE HCL 0.4 MG/ML IJ SOLN: 0.0400 mg | INTRAMUSCULAR | Status: DC | PRN

## 2024-07-06 MED ORDER — FENTANYL CITRATE (PF) 100 MCG/2ML IJ SOLN
INTRAMUSCULAR | Status: AC
  Filled 2024-07-06: qty 4

## 2024-07-06 MED ORDER — ONDANSETRON 4 MG OR TBDP: 4.0000 mg | ORAL_TABLET | Freq: Once | ORAL | Status: DC | PRN

## 2024-07-06 MED ORDER — FENTANYL CITRATE (PF) 100 MCG/2ML IJ SOLN
25.0000 ug | INTRAMUSCULAR | Status: AC | PRN
  Administered 2024-07-06: 25 ug via INTRAVENOUS
  Administered 2024-07-06: 50 ug via INTRAVENOUS
  Administered 2024-07-06: 25 ug via INTRAVENOUS

## 2024-07-06 MED ORDER — ATROPINE SULFATE 1 MG/10ML IJ SOSY: 0.5000 mg | PREFILLED_SYRINGE | INTRAMUSCULAR | Status: AC | PRN

## 2024-07-06 MED ORDER — ONDANSETRON HCL 4 MG/2ML IJ SOLN: 4.0000 mg | Freq: Three times a day (TID) | INTRAMUSCULAR | Status: AC | PRN

## 2024-07-06 MED ORDER — EPINEPHRINE 1 MG/10ML IV SOSY: 1.0000 mg | PREFILLED_SYRINGE | INTRAVENOUS | Status: DC | PRN

## 2024-07-06 MED ORDER — GLUCAGON EMERGENCY 1 MG IJ SOLR: 1.0000 mg | INTRAMUSCULAR | Status: DC | PRN

## 2024-07-06 MED ORDER — DEXTROSE 50 % IV SOLN: 50.0000 mL | INTRAVENOUS | Status: DC | PRN

## 2024-07-06 MED ORDER — DIPHENHYDRAMINE HCL 50 MG/ML IJ SOLN: 25.0000 mg | Freq: Once | INTRAMUSCULAR | Status: DC | PRN

## 2024-07-06 MED ORDER — GLUCAGON EMERGENCY 1 MG IJ SOLR: 0.5000 mg | INTRAMUSCULAR | Status: DC | PRN

## 2024-07-06 MED ORDER — FLUMAZENIL 0.5 MG/5ML IV SOLN: 0.2000 mg | INTRAVENOUS | Status: DC | PRN

## 2024-07-06 MED ORDER — LIDOCAINE HCL URETHRAL/MUCOSAL 2 % EX PRSY: 6.0000 mL | PREFILLED_SYRINGE | Freq: Once | CUTANEOUS | Status: DC | PRN

## 2024-07-06 MED ORDER — GLUCAGON EMERGENCY 1 MG IJ SOLR: 0.2500 mg | INTRAMUSCULAR | Status: DC | PRN

## 2024-07-06 MED ORDER — MIDAZOLAM HCL (PF) 5 MG/5ML IJ SOLN
INTRAMUSCULAR | Status: AC
  Filled 2024-07-06: qty 10

## 2024-07-06 MED ORDER — DEXTROSE 50 % IV SOLN: 25.0000 mL | INTRAVENOUS | Status: DC | PRN

## 2024-07-06 MED ORDER — SODIUM CHLORIDE 0.9 % IV SOLN: 125.0000 mL/h | INTRAVENOUS | Status: AC

## 2024-07-06 MED ORDER — HEPARIN NA (PORK) LOCK FLUSH 100 UNIT/ML IV SOLN (WRAPPER): 5.0000 mL | Freq: Once | INTRAVENOUS | Status: DC | PRN

## 2024-07-06 MED ORDER — MIDAZOLAM HCL (PF) 5 MG/5ML IJ SOLN
1.0000 mg | INTRAMUSCULAR | Status: AC | PRN
  Administered 2024-07-06 (×2): 2 mg via INTRAVENOUS
  Administered 2024-07-06: 1 mg via INTRAVENOUS

## 2024-07-06 MED ORDER — METOCLOPRAMIDE HCL 5 MG/ML IJ SOLN: 10.0000 mg | Freq: Once | INTRAMUSCULAR | Status: DC | PRN

## 2024-07-06 MED ORDER — INSULIN LISPRO 100 UNIT/ML IJ SOLN: 2.0000 [IU] | INTRAMUSCULAR | Status: DC | PRN

## 2024-07-06 NOTE — H&P (Signed)
 Moderate Sedation Pre-Procedure Assessment/H&P    Procedure  COLONOSCOPY - SCREENING & SURVEILLANCE [GICOLONSS]    Sedation RN Data     Patient Language English   HISTORY   Problem List[1]    Medical History[2]    Surgical History[3]    Social History[4]    Family History[5]       Cannot display prior to admission medications because the patient has not been admitted in this contact.       Inpatient Medications   SCHEDULED MEDICATIONS:     COVID-19 Moderna mRNA vaccine, 50 mcg, Once    INFUSED MEDICATIONS:    sodium chloride , 125 mL/hr, Continuous     PRN MEDICATIONS:    atropine , 0.5 mg, PRN    atropine , 0.5 mg, PRN    dextrose , 25 mL, PRN    dextrose , 50 mL, PRN    diphenhydrAMINE , 25 mg, Once PRN    EPINEPHrine , 1 mg, PRN    EPINEPHrine , 1 mg, PRN    fentaNYL  PF, 25-50 mcg, PRN    flumazenil , 0.2 mg, q1 min PRN    glucagon , 0.25 mg, PRN    glucagon , 0.25 mg, PRN    glucagon , 0.5 mg, PRN    glucagon , 1 mg, PRN    heparin  flush, 5 mL, Once PRN    insulin  LISPRO, 2-6 units, PRN    lidocaine , 6 mL, Once PRN    lidocaine , 6 mL, Once PRN    metoclopramide , 10 mg, Once PRN    midazolam , 1-2 mg, PRN    naloxone , 0.04 mg, q2 min PRN    ondansetron , 4-8 mg, Once PRN    ondansetron , 4-8 mg, q8h PRN       Anticoagulants   Is patient on anticoagulation meds?: Yes (Eliquis , held since 06/28/24)  Anticoagulation labs and last medication admin verified?: (not recorded)         NPO Status   Date of Last Liquid: 07/06/24  Time of Last Liquid: 0730  Date of Last Solid: 07/05/24  Time of Last Solid: 1130     Activity Tolerance   Assessment of climbing up 2 flights of stairs: Able to climb withOUT difficulty  Assessment of walking 2 blocks: Able to walk withOUT difficulty     Escort/Ride Home Info   Escort/Ride home contact name: Delon sister, [place], [phone]  Escort/Ride home contact relationship: (not recorded)  Escort/Ride home contact number(s): (not recorded)  Escort/Ride home contact location: (not recorded)     Sedation  Provider Statement  I concur with the RN's pre-procedure assessment above.    [facility] PRESEDATION PE    Vitals:   Vitals (Most recent in last 24 hrs)     T: 36.4 C (07/06/24 0857)  BP: 128/78 (07/06/24 0857)  HR: (!) 58 (07/06/24 0857)  RR: 16 (07/06/24 0857)  SpO2: 100 % (07/06/24 0857) Room air  T range: Temp  Min: 36.4 C  Max: 36.4 C  Admit weight: (not recorded)  Last weight: (not recorded)       Recent Labs  Lab Results   Component Value Date    WBC 19.41 (H) 01/25/2024    HEMOGLOBIN 14.2 01/25/2024    HEMATOCRIT 43 01/25/2024    PLATELET 348 01/25/2024    SODIUM 136 01/25/2024    POTASSIUM 3.9 01/25/2024    BUN 13 01/25/2024    CREATININE 0.93 01/25/2024    GLUCOSE 81 01/25/2024    INR 1.0 08/04/2021    PROTIME 12.9 08/04/2021    PTT 29  08/04/2021    AST 25 01/25/2024    ALT 28 01/25/2024    ALK 48 01/25/2024    ALBUMIN 4.7 01/25/2024       Physical Exam   Airway:  Mallampati: Class 1: Full visibility of tonsils, uvula and soft palate      Cardiovascular: Normal    Respiratory: Normal    Abdominal: Normal    Neurological/Neuromuscular: Normal    Additional findings: none    Risk Factors  Status: Low:  ASA II.  Patient with mild systemic disease    Assessment: Okay for sedation    Plan for Sedation: Moderate sedation.    Attestation  I am a Patent Attorney (Attending, ARNP, PA-C, [facility] RN) with privileges for moderate sedation.    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 No pertinent surgical history.  [4]   Social History  Tobacco Use    Smoking status: Former     Current packs/day: 0.00     Average packs/day: 0.5 packs/day for 10.0 years (5.0 ttl pk-yrs)     Types: Cigarettes     Start date: 2000     Quit date: 2010     Years since quitting: 15.9     Passive exposure: Past    Smokeless tobacco: Never   Substance and Sexual Activity    Alcohol use: Yes     Alcohol/week: 1.0 - 6.0 standard drink of alcohol     Types: 1 - 6 Standard drinks or equivalent per week    Drug use: Never    Sexual activity: Yes     Partners: Female     Birth control/protection: Condom   Social History Narrative    Psychologist, educational   [5]   Family History  Problem Relation Age of Onset    Colon Cancer No Hx Of     Diabetes Father     Heart Attack Father      Paternal Grandfather      Paternal Grandmother     Other (cabg) Father     Rheumatoid Arthritis Mother     Thyroid Disease Mother

## 2024-07-13 LAB — PATHOLOGY, SURGICAL

## 2024-07-14 ENCOUNTER — Encounter (HOSPITAL_BASED_OUTPATIENT_CLINIC_OR_DEPARTMENT_OTHER): Payer: Self-pay | Admitting: Gastroenterology
